# Patient Record
Sex: Female | Born: 1940 | Race: White | Hispanic: No | Smoking: Never smoker
Health system: Southern US, Community
[De-identification: ages and names within clinical notes are randomized; demographics above are authoritative.]

## PROBLEM LIST (undated history)

## (undated) DIAGNOSIS — C801 Malignant (primary) neoplasm, unspecified: Secondary | ICD-10-CM

## (undated) DIAGNOSIS — M1990 Unspecified osteoarthritis, unspecified site: Secondary | ICD-10-CM

## (undated) DIAGNOSIS — R42 Dizziness and giddiness: Secondary | ICD-10-CM

## (undated) DIAGNOSIS — K449 Diaphragmatic hernia without obstruction or gangrene: Secondary | ICD-10-CM

## (undated) DIAGNOSIS — I10 Essential (primary) hypertension: Secondary | ICD-10-CM

## (undated) DIAGNOSIS — I639 Cerebral infarction, unspecified: Secondary | ICD-10-CM

## (undated) HISTORY — DX: Essential (primary) hypertension: I10

## (undated) HISTORY — PX: WISDOM TOOTH EXTRACTION: SHX21

## (undated) HISTORY — DX: Unspecified osteoarthritis, unspecified site: M19.90

---

## 1973-09-27 HISTORY — PX: TONSILLECTOMY: SHX5217

## 1986-09-27 HISTORY — PX: DILATION AND CURETTAGE OF UTERUS: SHX78

## 2001-11-13 ENCOUNTER — Other Ambulatory Visit: Admission: RE | Admit: 2001-11-13 | Discharge: 2001-11-13 | Payer: Self-pay | Admitting: Internal Medicine

## 2005-02-25 ENCOUNTER — Other Ambulatory Visit: Admission: RE | Admit: 2005-02-25 | Discharge: 2005-02-25 | Payer: Self-pay | Admitting: Internal Medicine

## 2005-05-07 ENCOUNTER — Ambulatory Visit (HOSPITAL_COMMUNITY): Admission: RE | Admit: 2005-05-07 | Discharge: 2005-05-07 | Payer: Self-pay | Admitting: *Deleted

## 2005-05-07 ENCOUNTER — Encounter (INDEPENDENT_AMBULATORY_CARE_PROVIDER_SITE_OTHER): Payer: Self-pay | Admitting: Specialist

## 2008-11-14 ENCOUNTER — Other Ambulatory Visit: Admission: RE | Admit: 2008-11-14 | Discharge: 2008-11-14 | Payer: Self-pay | Admitting: Internal Medicine

## 2008-12-04 ENCOUNTER — Encounter: Admission: RE | Admit: 2008-12-04 | Discharge: 2008-12-04 | Payer: Self-pay | Admitting: Internal Medicine

## 2009-12-01 IMAGING — CT CT CHEST W/O CM
2 of 3 series · 15 of 36 positions shown, 18 images · non-contrast
Comparison: None

CLINICAL DATA: Swollen left supraclavicular region, evaluate for
adenopathy

CT CHEST WITHOUT CONTRAST
TECHNIQUE: Multidetector CT imaging of the chest was performed
following the standard protocol without IV contrast.

[Series 3: routine chest · axial · 0.70mm/px · z∈[-240,+20]mm · 12 of 62 slices shown, 15 images]
[im 5/62  mediastinal]
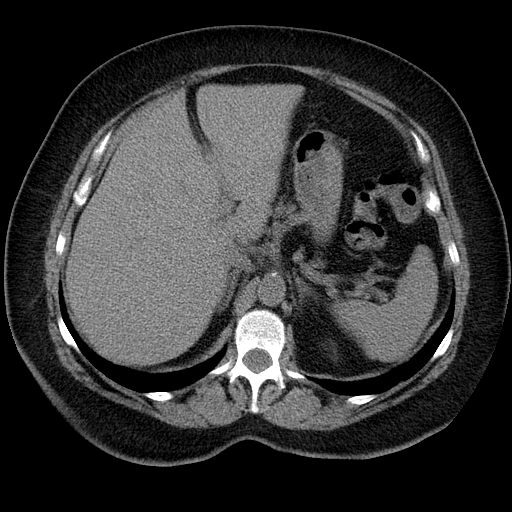
[im 5/62  lung]
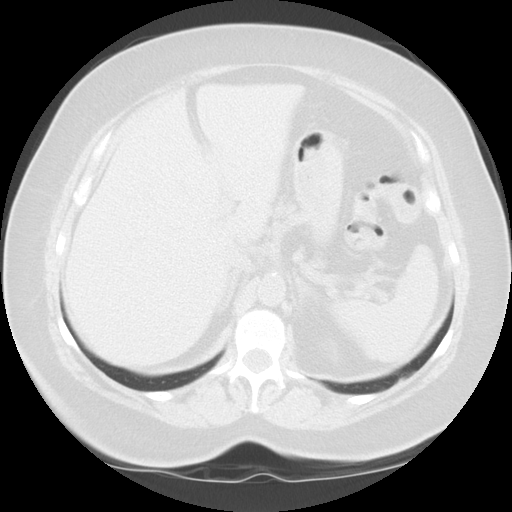
[im 10/62  lung]
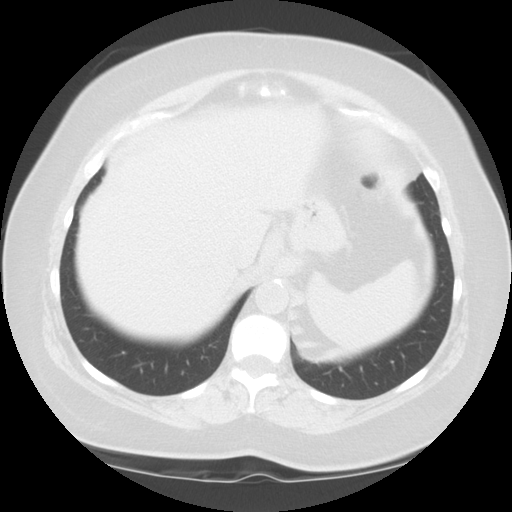
[im 14/62  lung]
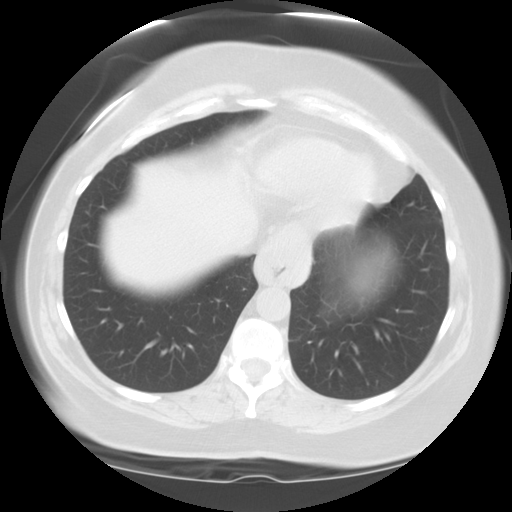
[im 19/62  lung]
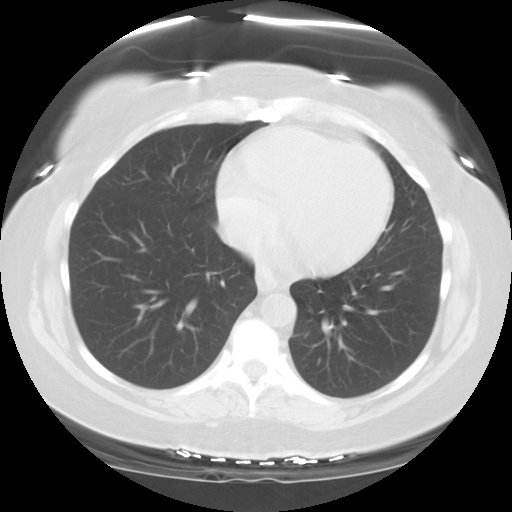
[im 23/62  mediastinal]
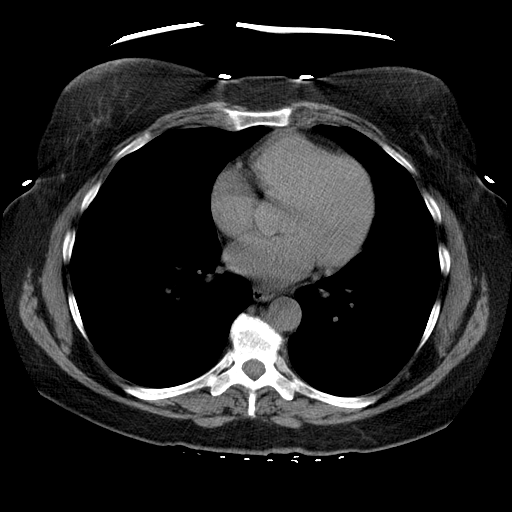
[im 23/62  lung]
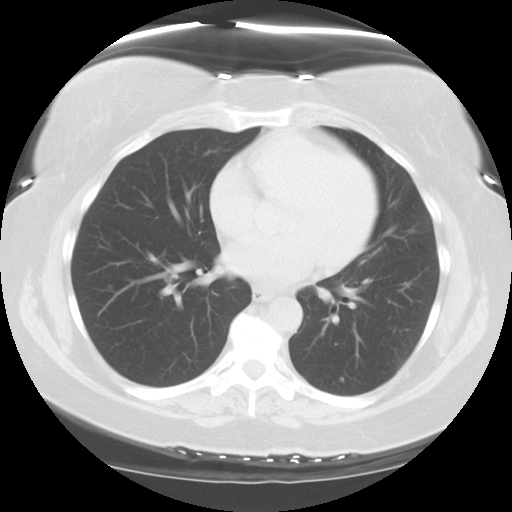
[im 28/62  lung]
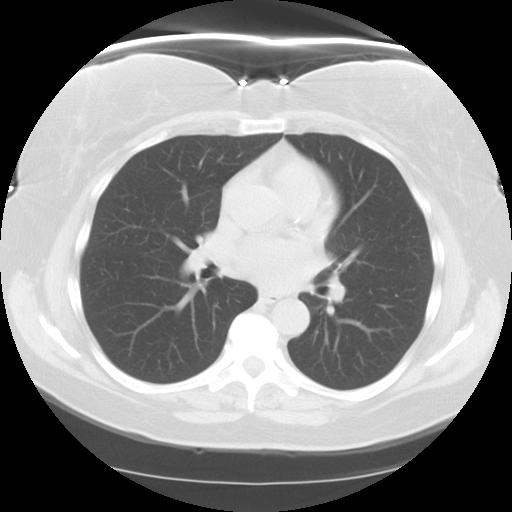
[im 34/62  lung]
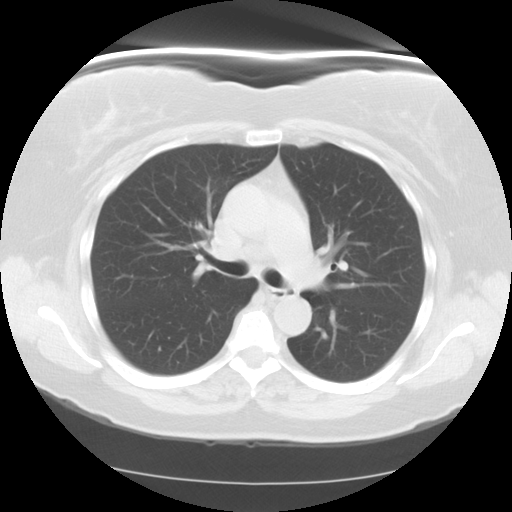
[im 39/62  lung]
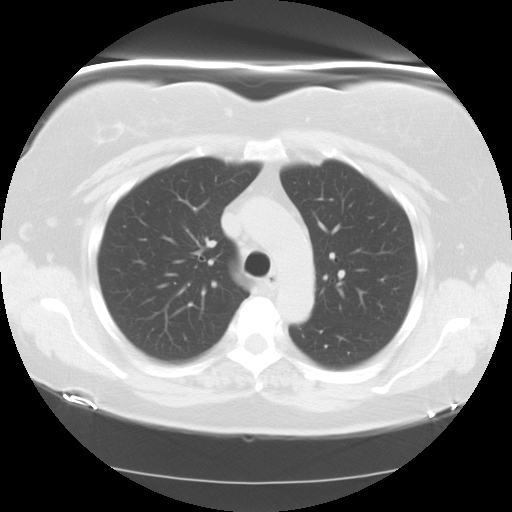
[im 43/62  mediastinal]
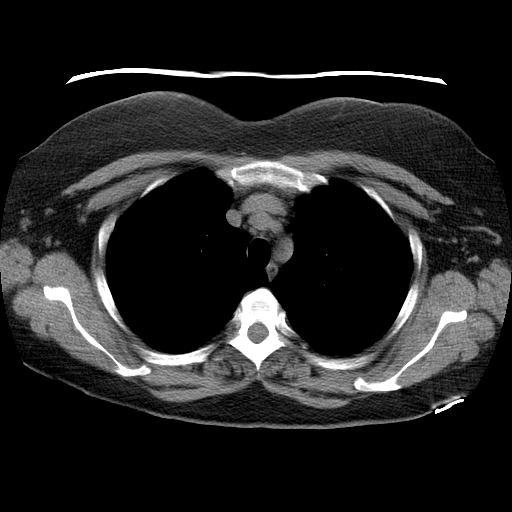
[im 43/62  lung]
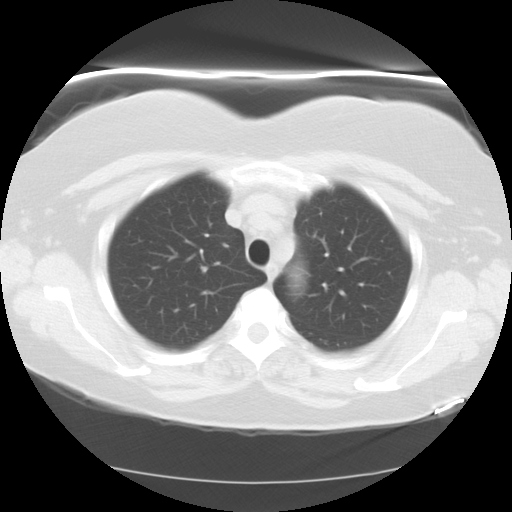
[im 48/62  lung]
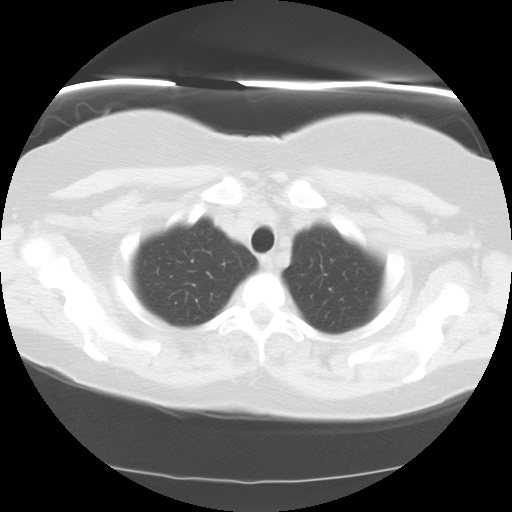
[im 52/62  lung]
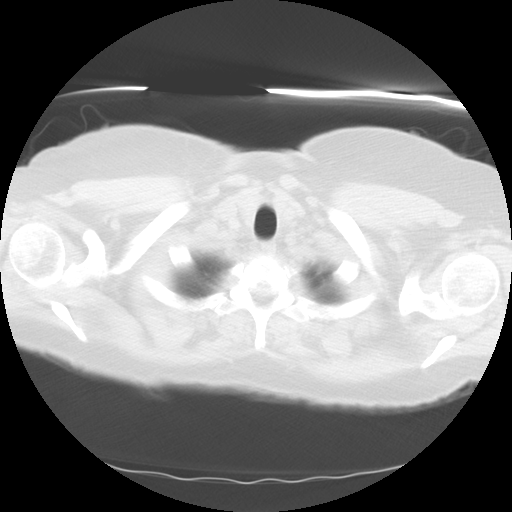
[im 57/62  lung]
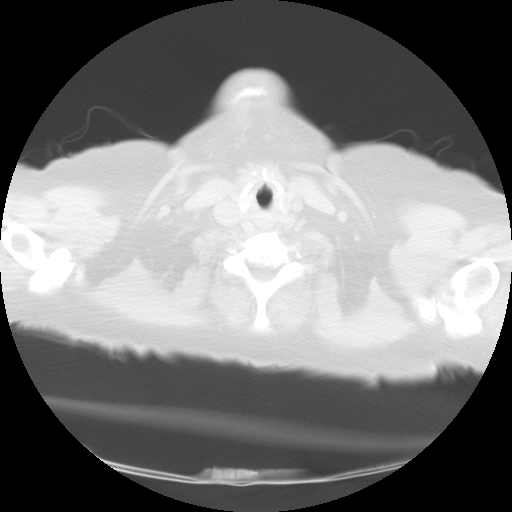

[Series 602: sagittal body · sagittal · 0.70mm/px · 3 of 145 slices shown]
[im 29/145  lung]
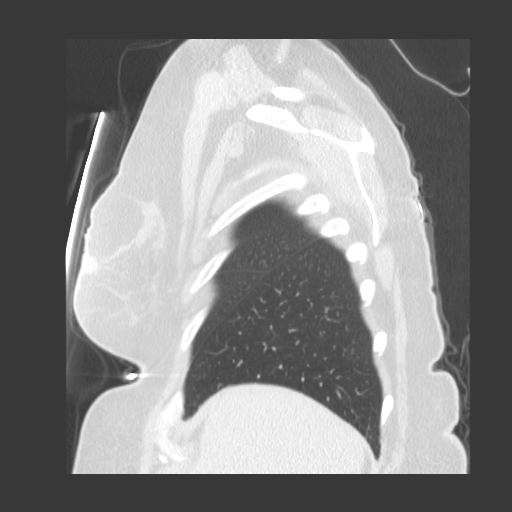
[im 58/145  lung]
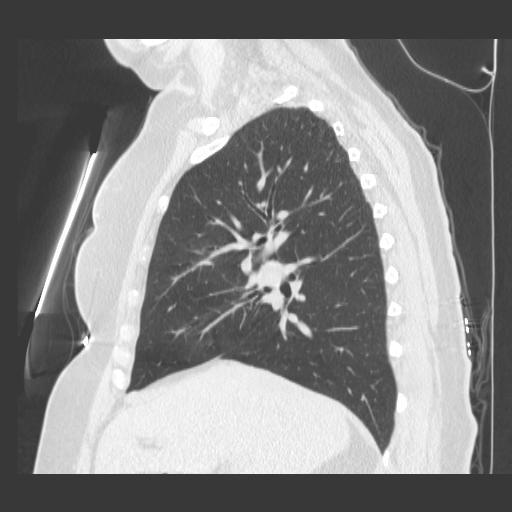
[im 87/145  lung]
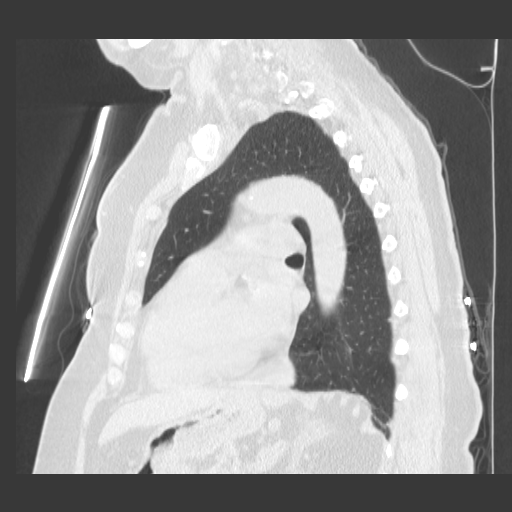

[15 of 36 positions shown; findings below may reference images not displayed]

FINDINGS: On the lung window images a few small lung nodules are
noted bilaterally, with one in the right lower lobe, another small
noncalcified nodule in the left lower lobe, and a small nodule in
the left lower lobe as well.  These nodules may well represent
prior granulomatous disease but follow-up is recommended. If the
patient is at high risk for bronchogenic carcinoma, follow-up chest
CT at 6-12 months is recommended.  If the patient is at low risk
for bronchogenic carcinoma, follow-up chest CT at 12 months is
recommended.  This recommendation follows the consensus statement:
"Guidelines for Management of Small Pulmonary Nodules Detected on
CT Scans: A Statement from the [HOSPITAL]" as published in
[URL] No
infiltrate or effusion is seen.

No mediastinal or hilar adenopathy is seen. No evidence of
supraclavicular adenopathy is seen.  The thyroid gland appears be
normal in size.  On only slightly prominent precarinal node is
present.  There is a small to moderate sized hiatal hernia present.
IMPRESSION: 1.  No evidence of supraclavicular, mediastinal, or hilar
adenopathy.
 2.  Small lung nodules as noted above.  Consider follow-up CT of
the chest with guidelines given above.
 3.  Moderate sized hiatal hernia.

## 2010-08-04 ENCOUNTER — Other Ambulatory Visit: Admission: RE | Admit: 2010-08-04 | Discharge: 2010-08-04 | Payer: Self-pay | Admitting: Internal Medicine

## 2011-02-12 NOTE — Op Note (Signed)
NAMECAMELLIA, Alyssa Taylor                 ACCOUNT NO.:  [id number]   MEDICAL RECORD NO.:  [id number]          PATIENT TYPE:  AMB   LOCATION:  ENDO                         FACILITY:  [facility]   PHYSICIAN:  Georgiana Spinner, M.D.    DATE OF BIRTH:  Nov 01, 1940   DATE OF PROCEDURE:  05/07/2005  DATE OF DISCHARGE:                                 OPERATIVE REPORT   PROCEDURE:  Colonoscopy.   INDICATIONS:  Colon cancer screening.   ANESTHESIA:  Demerol 30, Versed 3 mg.   DESCRIPTION OF PROCEDURE:  With the patient mildly sedated in the left  lateral decubitus position, the Olympus videoscopic colonoscope was inserted  into the rectum and passed under direct vision to the cecum identified by  the ileocecal valve and appendiceal orifice both of which were photographed.  From this point, we entered into the terminal colon which also appeared  normal. The endoscope was then withdrawn taking circumferential views of the  colonic mucosa stopping to photograph diverticula seen along the way in the  sigmoid colon until we reached the rectum which appeared normal on direct  and showed hemorrhoids on retroflexed view. The endoscope straightened and  withdrawn. The patient's vital signs and pulse oximeter remained stable. The  patient tolerated procedure well without apparent complications.   FINDINGS:  Internal hemorrhoids and some diverticula of the sigmoid colon  otherwise unremarkable examination.   PLAN:  See endoscopy note for further details.       GMO/MEDQ  D:  05/07/2005  T:  05/07/2005  Job:  [id number]

## 2011-02-12 NOTE — Op Note (Signed)
Alyssa Taylor, Alyssa Taylor                 ACCOUNT NO.:  [id number]   MEDICAL RECORD NO.:  [id number]          PATIENT TYPE:  AMB   LOCATION:  ENDO                         [facility]ACILITY:  [facility]   PHYSICIAN:  Georgiana Spinner, M.D.    DATE OF BIRTH:  [birth year]   DATE OF PROCEDURE:  05/07/2005  DATE OF DISCHARGE:                                 OPERATIVE REPORT   PROCEDURE:  Upper endoscopy.   INDICATIONS:  Gastroesophageal reflux disease.   ANESTHESIA:  Demerol 50, Versed 5 mg.   DESCRIPTION OF PROCEDURE:  With the patient mildly sedated in the left  lateral decubitus position, the Olympus videoscopic endoscope was inserted  in the mouth and passed under direct vision through the esophagus which  appeared normal until we reached the distal esophagus and there appeared to  be a small ulceration and erosions of the distal most esophagus which was  photographed at first. We then entered in the stomach, fundus, body, antrum,  duodenal bulb, second portion of duodenum were visualized. From this point,  the endoscope was slowly withdrawn taking circumferential views of duodenal  mucosa until the endoscope had been pulled back into the stomach, placed in  retroflexion to view the stomach from below at which point we biopsied these  ulcerations and erosions. The endoscope was then straightened and withdrawn  taking circumferential views of the remaining gastric and esophageal mucosa.  The patient's vital signs and pulse oximeter remained stable. The patient  tolerated the procedure well without apparent complications.   FINDINGS:  Erosions and ulceration, mild, of the distal most esophagus.  Await biopsy report. The patient will call me for results and follow-up with  me as an outpatient. Proceed to colonoscopy as planned.       GMO/MEDQ  D:  05/07/2005  T:  05/07/2005  Job:  [id number]

## 2011-07-26 ENCOUNTER — Other Ambulatory Visit (HOSPITAL_COMMUNITY): Payer: Self-pay | Admitting: Internal Medicine

## 2011-07-26 DIAGNOSIS — R911 Solitary pulmonary nodule: Secondary | ICD-10-CM

## 2011-07-29 ENCOUNTER — Other Ambulatory Visit (HOSPITAL_COMMUNITY): Payer: Self-pay

## 2011-10-18 ENCOUNTER — Other Ambulatory Visit: Payer: Self-pay | Admitting: Internal Medicine

## 2011-10-18 DIAGNOSIS — E041 Nontoxic single thyroid nodule: Secondary | ICD-10-CM

## 2011-10-20 ENCOUNTER — Other Ambulatory Visit (HOSPITAL_COMMUNITY)
Admission: RE | Admit: 2011-10-20 | Discharge: 2011-10-20 | Disposition: A | Discharge status: Home / Self Care | Payer: PRIVATE HEALTH INSURANCE | Source: Ambulatory Visit | Attending: Interventional Radiology | Admitting: Interventional Radiology

## 2011-10-20 ENCOUNTER — Ambulatory Visit
Admission: RE | Admit: 2011-10-20 | Discharge: 2011-10-20 | Disposition: A | Discharge status: Home / Self Care | Payer: PRIVATE HEALTH INSURANCE | Source: Ambulatory Visit | Attending: Internal Medicine | Admitting: Internal Medicine

## 2011-10-20 DIAGNOSIS — E041 Nontoxic single thyroid nodule: Secondary | ICD-10-CM | POA: Insufficient documentation

## 2012-08-08 ENCOUNTER — Other Ambulatory Visit (HOSPITAL_COMMUNITY): Payer: Self-pay | Admitting: Internal Medicine

## 2012-08-08 DIAGNOSIS — R0989 Other specified symptoms and signs involving the circulatory and respiratory systems: Secondary | ICD-10-CM

## 2012-08-11 ENCOUNTER — Ambulatory Visit (HOSPITAL_COMMUNITY): Payer: PRIVATE HEALTH INSURANCE

## 2012-08-18 ENCOUNTER — Ambulatory Visit (HOSPITAL_COMMUNITY): Payer: PRIVATE HEALTH INSURANCE | Attending: Internal Medicine

## 2012-10-16 IMAGING — US US THYROID BIOPSY
1 series · 14 of 14 positions shown · non-contrast
Comparison: 09/02/2011

CLINICAL DATA: Dominant right inferior thyroid nodule

ULTRASOUND GUIDED NEEDLE ASPIRATE BIOPSY OF THE THYROID GLAND

[Series 1: us thyroid biopsy · 0.08mm/px · 14 acquisitions, 14 frames shown]
[im 1/14]
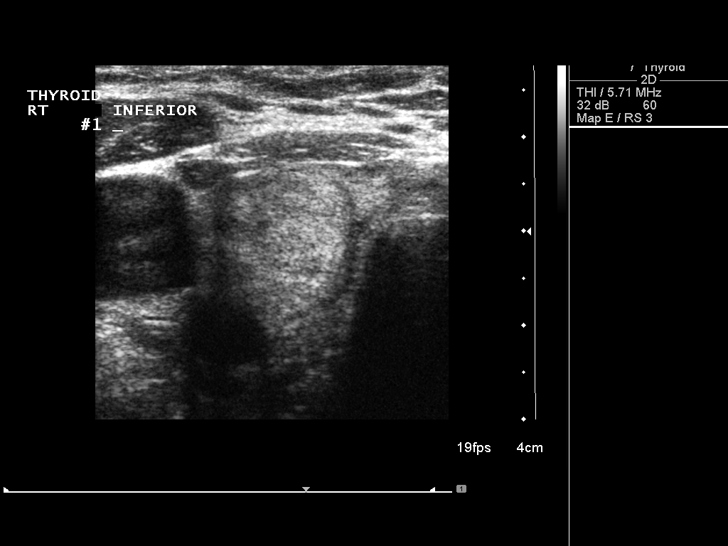
[im 2/14]
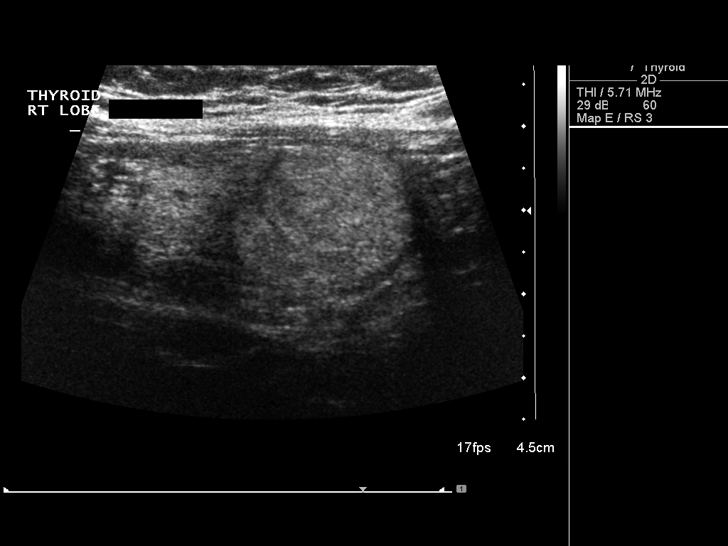
[im 3/14]
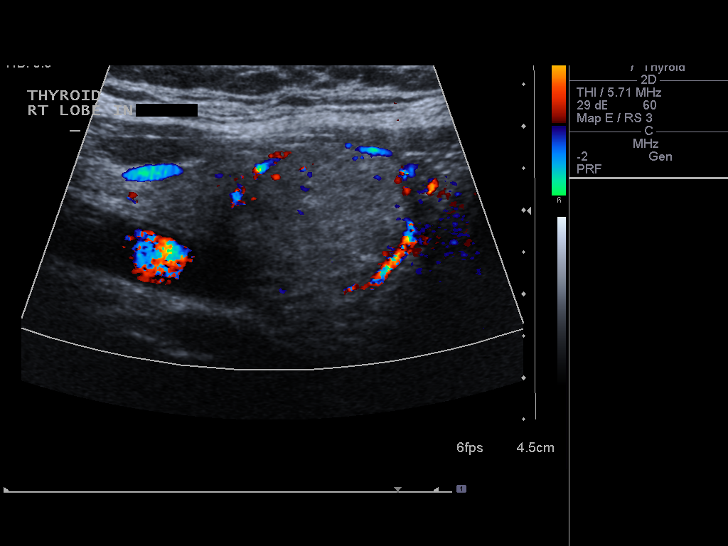
[im 4/14]
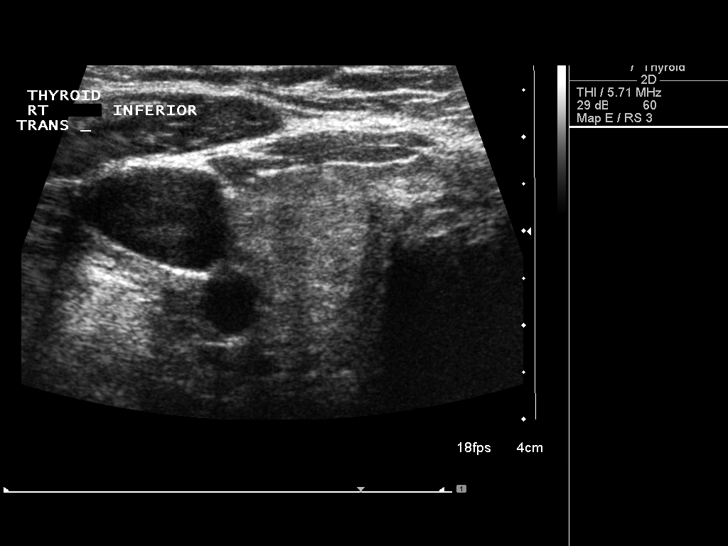
[im 5/14]
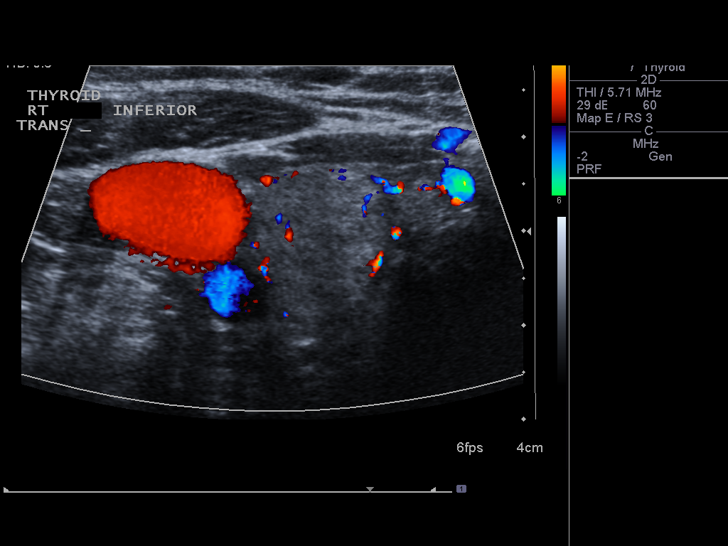
[im 6/14]
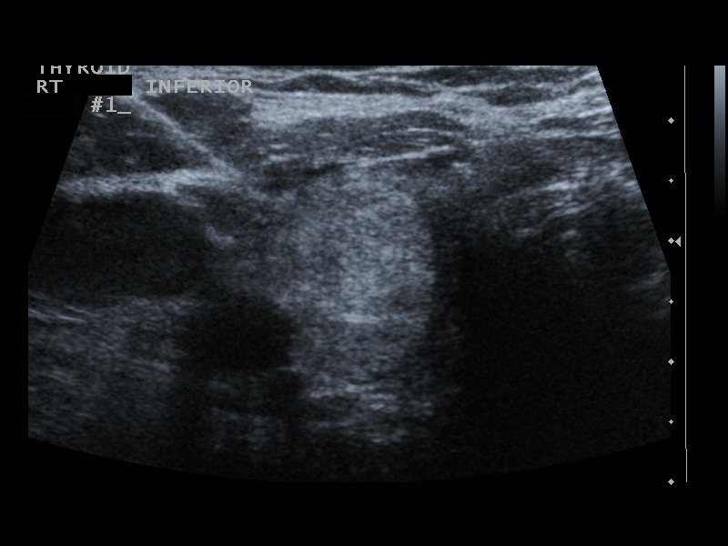
[im 7/14]
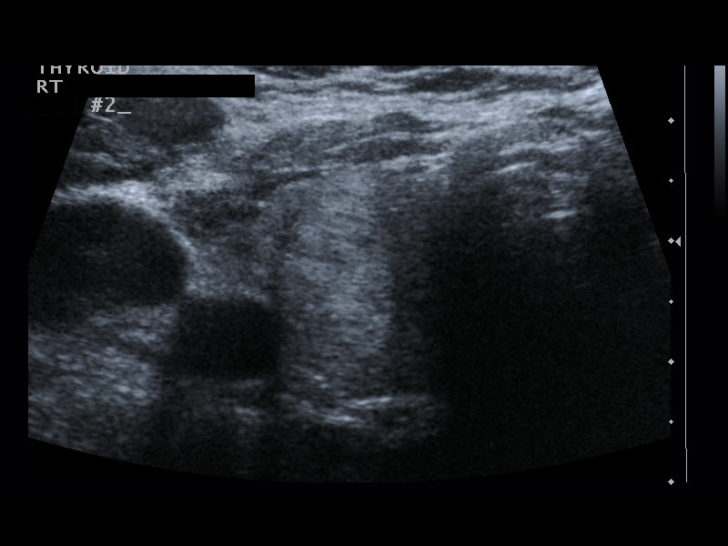
[im 8/14]
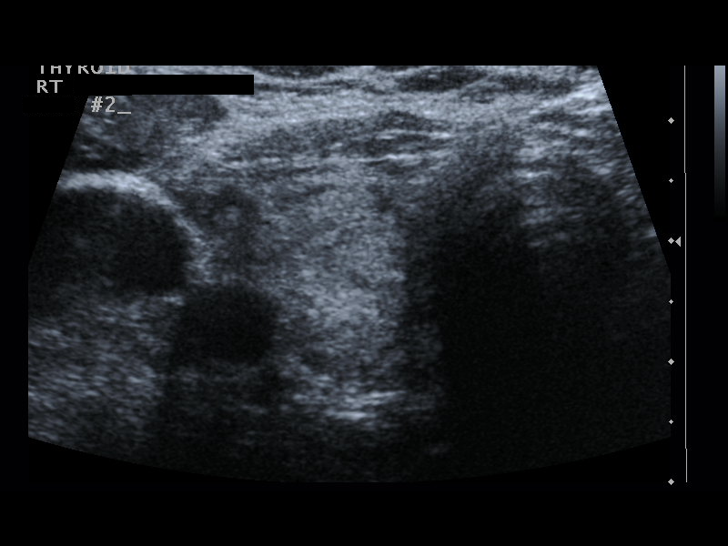
[im 9/14]
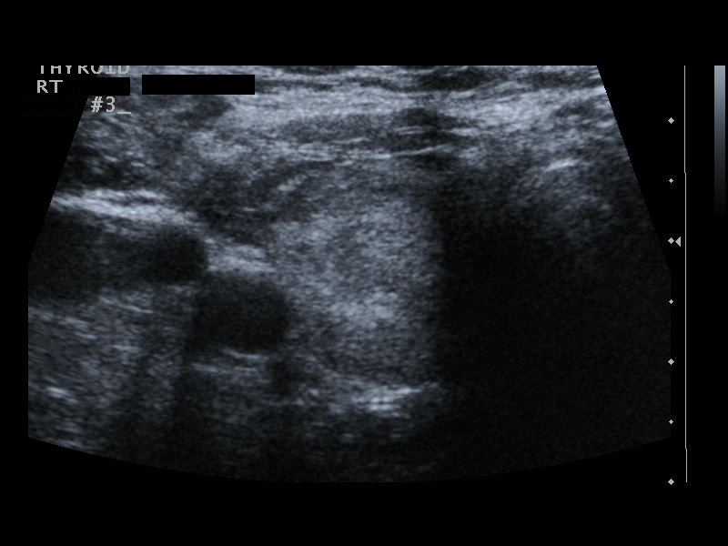
[im 10/14]
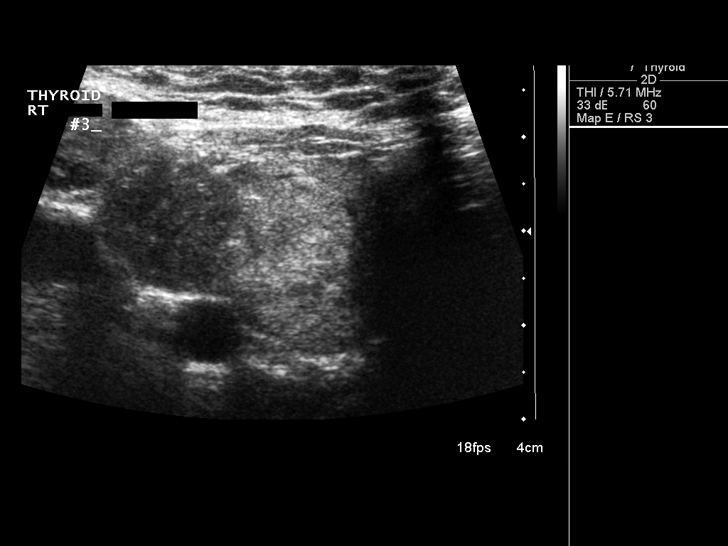
[im 11/14]
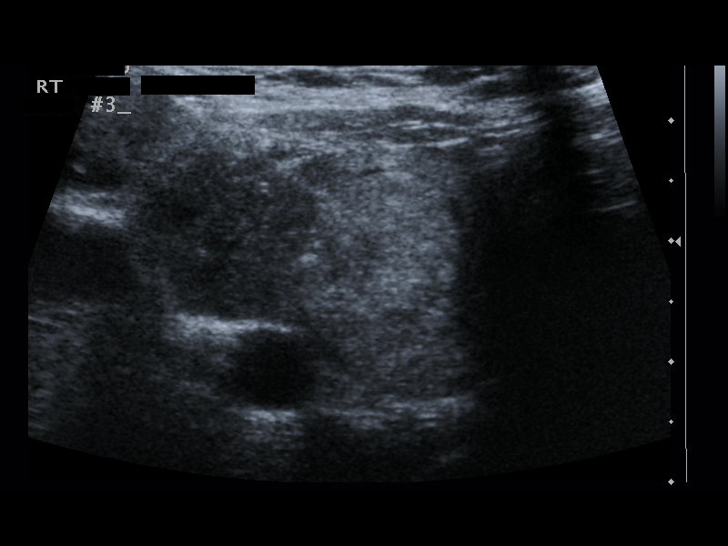
[im 12/14]
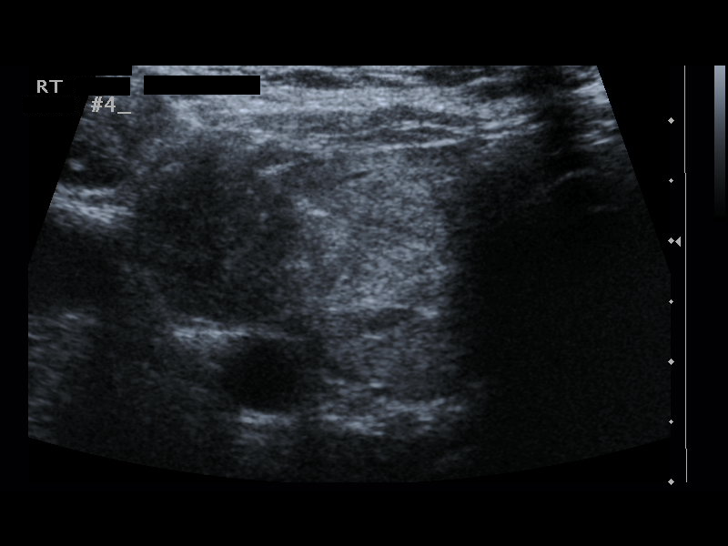
[im 13/14]
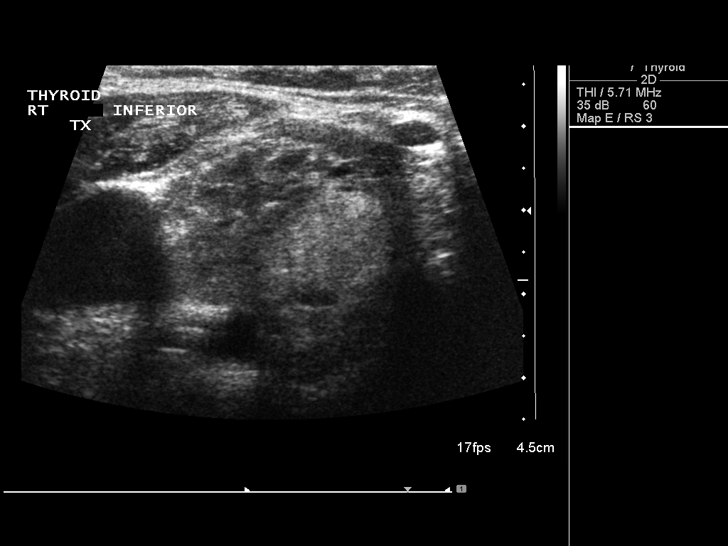
[im 14/14]
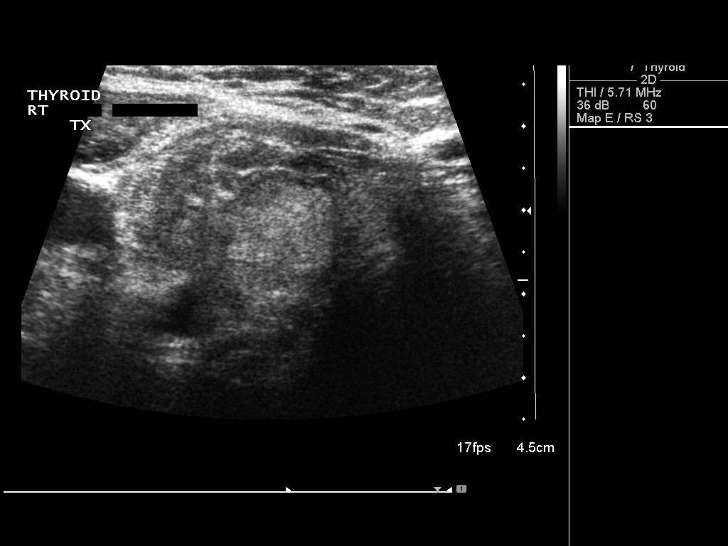

[14 of 14 positions shown; findings below may reference images not displayed]

Thyroid biopsy was thoroughly discussed with the patient and
questions were answered.  The benefits, risks, alternatives, and
complications were also discussed.  The patient understands and
wishes to proceed with the procedure.  Written consent was
obtained.

Ultrasound was performed to localize and mark an adequate site for
the biopsy.  The patient was then prepped and draped in a normal
sterile fashion.  Local anesthesia was provided with 1% lidocaine.
Using direct ultrasound guidance, 4 passes were made using 25 gauge
needles into the nodule within the right lobe of the thyroid.
Ultrasound was used to confirm needle placements on all occasions.
Specimens were sent to Pathology for analysis.

Complications:  No immediate
FINDINGS: Imaging confirms needle placement in the dominant solid
right inferior thyroid nodule.
IMPRESSION: Ultrasound guided needle aspirate biopsy performed of the dominant
right inferior thyroid nodule.

## 2016-02-02 DIAGNOSIS — H34831 Tributary (branch) retinal vein occlusion, right eye, with macular edema: Secondary | ICD-10-CM | POA: Diagnosis not present

## 2016-02-02 DIAGNOSIS — H25093 Other age-related incipient cataract, bilateral: Secondary | ICD-10-CM | POA: Diagnosis not present

## 2016-02-12 DIAGNOSIS — H34831 Tributary (branch) retinal vein occlusion, right eye, with macular edema: Secondary | ICD-10-CM | POA: Diagnosis not present

## 2016-03-16 DIAGNOSIS — Z0000 Encounter for general adult medical examination without abnormal findings: Secondary | ICD-10-CM | POA: Diagnosis not present

## 2016-03-16 DIAGNOSIS — I10 Essential (primary) hypertension: Secondary | ICD-10-CM | POA: Diagnosis not present

## 2016-03-17 DIAGNOSIS — I83812 Varicose veins of left lower extremities with pain: Secondary | ICD-10-CM | POA: Diagnosis not present

## 2016-03-17 DIAGNOSIS — R58 Hemorrhage, not elsewhere classified: Secondary | ICD-10-CM | POA: Diagnosis not present

## 2016-03-17 DIAGNOSIS — I8312 Varicose veins of left lower extremity with inflammation: Secondary | ICD-10-CM | POA: Diagnosis not present

## 2016-03-17 DIAGNOSIS — H539 Unspecified visual disturbance: Secondary | ICD-10-CM | POA: Diagnosis not present

## 2016-03-17 DIAGNOSIS — I8390 Asymptomatic varicose veins of unspecified lower extremity: Secondary | ICD-10-CM | POA: Diagnosis not present

## 2016-04-06 DIAGNOSIS — I8312 Varicose veins of left lower extremity with inflammation: Secondary | ICD-10-CM | POA: Diagnosis not present

## 2016-04-06 DIAGNOSIS — R58 Hemorrhage, not elsewhere classified: Secondary | ICD-10-CM | POA: Diagnosis not present

## 2016-07-15 DIAGNOSIS — M7062 Trochanteric bursitis, left hip: Secondary | ICD-10-CM | POA: Diagnosis not present

## 2016-08-25 DIAGNOSIS — S4992XA Unspecified injury of left shoulder and upper arm, initial encounter: Secondary | ICD-10-CM | POA: Diagnosis not present

## 2016-08-25 DIAGNOSIS — M25561 Pain in right knee: Secondary | ICD-10-CM | POA: Diagnosis not present

## 2016-08-25 DIAGNOSIS — S8990XA Unspecified injury of unspecified lower leg, initial encounter: Secondary | ICD-10-CM | POA: Diagnosis not present

## 2016-08-25 DIAGNOSIS — S8992XA Unspecified injury of left lower leg, initial encounter: Secondary | ICD-10-CM | POA: Diagnosis not present

## 2016-08-25 DIAGNOSIS — M75122 Complete rotator cuff tear or rupture of left shoulder, not specified as traumatic: Secondary | ICD-10-CM | POA: Diagnosis not present

## 2016-08-25 DIAGNOSIS — M25512 Pain in left shoulder: Secondary | ICD-10-CM | POA: Diagnosis not present

## 2016-08-25 DIAGNOSIS — S3993XA Unspecified injury of pelvis, initial encounter: Secondary | ICD-10-CM | POA: Diagnosis not present

## 2016-08-25 DIAGNOSIS — M79662 Pain in left lower leg: Secondary | ICD-10-CM | POA: Diagnosis not present

## 2016-08-25 DIAGNOSIS — I10 Essential (primary) hypertension: Secondary | ICD-10-CM | POA: Diagnosis not present

## 2016-08-25 DIAGNOSIS — S8012XA Contusion of left lower leg, initial encounter: Secondary | ICD-10-CM | POA: Diagnosis not present

## 2016-08-25 DIAGNOSIS — S79912A Unspecified injury of left hip, initial encounter: Secondary | ICD-10-CM | POA: Diagnosis not present

## 2016-08-25 DIAGNOSIS — S83401A Sprain of unspecified collateral ligament of right knee, initial encounter: Secondary | ICD-10-CM | POA: Diagnosis not present

## 2016-08-25 DIAGNOSIS — M25552 Pain in left hip: Secondary | ICD-10-CM | POA: Diagnosis not present

## 2016-08-25 DIAGNOSIS — R102 Pelvic and perineal pain: Secondary | ICD-10-CM | POA: Diagnosis not present

## 2016-08-26 DIAGNOSIS — I10 Essential (primary) hypertension: Secondary | ICD-10-CM | POA: Diagnosis not present

## 2016-08-26 DIAGNOSIS — M25512 Pain in left shoulder: Secondary | ICD-10-CM | POA: Diagnosis not present

## 2016-08-26 DIAGNOSIS — M25552 Pain in left hip: Secondary | ICD-10-CM | POA: Diagnosis not present

## 2016-08-26 DIAGNOSIS — W19XXXA Unspecified fall, initial encounter: Secondary | ICD-10-CM | POA: Diagnosis not present

## 2016-09-06 DIAGNOSIS — S8012XA Contusion of left lower leg, initial encounter: Secondary | ICD-10-CM | POA: Diagnosis not present

## 2016-09-06 DIAGNOSIS — S40012A Contusion of left shoulder, initial encounter: Secondary | ICD-10-CM | POA: Diagnosis not present

## 2016-09-17 DIAGNOSIS — R069 Unspecified abnormalities of breathing: Secondary | ICD-10-CM | POA: Diagnosis not present

## 2016-09-23 DIAGNOSIS — E785 Hyperlipidemia, unspecified: Secondary | ICD-10-CM | POA: Diagnosis not present

## 2016-09-23 DIAGNOSIS — Z780 Asymptomatic menopausal state: Secondary | ICD-10-CM | POA: Diagnosis not present

## 2016-09-23 DIAGNOSIS — I10 Essential (primary) hypertension: Secondary | ICD-10-CM | POA: Diagnosis not present

## 2016-09-23 DIAGNOSIS — Z0000 Encounter for general adult medical examination without abnormal findings: Secondary | ICD-10-CM | POA: Diagnosis not present

## 2016-11-25 DIAGNOSIS — H34831 Tributary (branch) retinal vein occlusion, right eye, with macular edema: Secondary | ICD-10-CM | POA: Diagnosis not present

## 2016-11-25 DIAGNOSIS — H34833 Tributary (branch) retinal vein occlusion, bilateral, with macular edema: Secondary | ICD-10-CM | POA: Diagnosis not present

## 2016-12-23 DIAGNOSIS — H34832 Tributary (branch) retinal vein occlusion, left eye, with macular edema: Secondary | ICD-10-CM | POA: Diagnosis not present

## 2016-12-23 DIAGNOSIS — H34833 Tributary (branch) retinal vein occlusion, bilateral, with macular edema: Secondary | ICD-10-CM | POA: Diagnosis not present

## 2017-01-19 DIAGNOSIS — Z0000 Encounter for general adult medical examination without abnormal findings: Secondary | ICD-10-CM | POA: Diagnosis not present

## 2017-01-19 DIAGNOSIS — Z9181 History of falling: Secondary | ICD-10-CM | POA: Diagnosis not present

## 2017-01-19 DIAGNOSIS — M130 Polyarthritis, unspecified: Secondary | ICD-10-CM | POA: Diagnosis not present

## 2017-01-19 DIAGNOSIS — Z791 Long term (current) use of non-steroidal anti-inflammatories (NSAID): Secondary | ICD-10-CM | POA: Diagnosis not present

## 2017-01-19 DIAGNOSIS — Z7982 Long term (current) use of aspirin: Secondary | ICD-10-CM | POA: Diagnosis not present

## 2017-01-19 DIAGNOSIS — M25559 Pain in unspecified hip: Secondary | ICD-10-CM | POA: Diagnosis not present

## 2017-01-19 DIAGNOSIS — M4055 Lordosis, unspecified, thoracolumbar region: Secondary | ICD-10-CM | POA: Diagnosis not present

## 2017-01-19 DIAGNOSIS — Z6837 Body mass index (BMI) 37.0-37.9, adult: Secondary | ICD-10-CM | POA: Diagnosis not present

## 2017-01-19 DIAGNOSIS — E669 Obesity, unspecified: Secondary | ICD-10-CM | POA: Diagnosis not present

## 2017-01-19 DIAGNOSIS — Z79899 Other long term (current) drug therapy: Secondary | ICD-10-CM | POA: Diagnosis not present

## 2017-01-25 DIAGNOSIS — H34833 Tributary (branch) retinal vein occlusion, bilateral, with macular edema: Secondary | ICD-10-CM | POA: Diagnosis not present

## 2017-04-12 DIAGNOSIS — H34833 Tributary (branch) retinal vein occlusion, bilateral, with macular edema: Secondary | ICD-10-CM | POA: Diagnosis not present

## 2017-05-13 DIAGNOSIS — H348332 Tributary (branch) retinal vein occlusion, bilateral, stable: Secondary | ICD-10-CM | POA: Diagnosis not present

## 2017-05-23 DIAGNOSIS — M25512 Pain in left shoulder: Secondary | ICD-10-CM | POA: Diagnosis not present

## 2017-05-23 DIAGNOSIS — M25561 Pain in right knee: Secondary | ICD-10-CM | POA: Diagnosis not present

## 2017-05-23 DIAGNOSIS — G8929 Other chronic pain: Secondary | ICD-10-CM | POA: Diagnosis not present

## 2017-05-23 DIAGNOSIS — Z0000 Encounter for general adult medical examination without abnormal findings: Secondary | ICD-10-CM | POA: Diagnosis not present

## 2017-08-02 DIAGNOSIS — M19011 Primary osteoarthritis, right shoulder: Secondary | ICD-10-CM | POA: Diagnosis not present

## 2017-08-02 DIAGNOSIS — M353 Polymyalgia rheumatica: Secondary | ICD-10-CM | POA: Diagnosis not present

## 2017-08-02 DIAGNOSIS — M25512 Pain in left shoulder: Secondary | ICD-10-CM | POA: Diagnosis not present

## 2017-08-02 DIAGNOSIS — M129 Arthropathy, unspecified: Secondary | ICD-10-CM | POA: Diagnosis not present

## 2017-08-02 DIAGNOSIS — M7072 Other bursitis of hip, left hip: Secondary | ICD-10-CM | POA: Diagnosis not present

## 2017-08-02 DIAGNOSIS — M25511 Pain in right shoulder: Secondary | ICD-10-CM | POA: Diagnosis not present

## 2017-08-02 DIAGNOSIS — M19012 Primary osteoarthritis, left shoulder: Secondary | ICD-10-CM | POA: Diagnosis not present

## 2017-08-02 DIAGNOSIS — M25519 Pain in unspecified shoulder: Secondary | ICD-10-CM | POA: Diagnosis not present

## 2017-08-24 DIAGNOSIS — Z0000 Encounter for general adult medical examination without abnormal findings: Secondary | ICD-10-CM | POA: Diagnosis not present

## 2018-08-22 DIAGNOSIS — E785 Hyperlipidemia, unspecified: Secondary | ICD-10-CM | POA: Diagnosis not present

## 2018-08-22 DIAGNOSIS — I10 Essential (primary) hypertension: Secondary | ICD-10-CM | POA: Diagnosis not present

## 2018-08-22 DIAGNOSIS — Z0001 Encounter for general adult medical examination with abnormal findings: Secondary | ICD-10-CM | POA: Diagnosis not present

## 2018-08-30 DIAGNOSIS — R7989 Other specified abnormal findings of blood chemistry: Secondary | ICD-10-CM | POA: Diagnosis not present

## 2018-08-30 DIAGNOSIS — Z01419 Encounter for gynecological examination (general) (routine) without abnormal findings: Secondary | ICD-10-CM | POA: Diagnosis not present

## 2018-08-30 DIAGNOSIS — Z0000 Encounter for general adult medical examination without abnormal findings: Secondary | ICD-10-CM | POA: Diagnosis not present

## 2018-08-30 DIAGNOSIS — Z780 Asymptomatic menopausal state: Secondary | ICD-10-CM | POA: Diagnosis not present

## 2020-12-05 DIAGNOSIS — I639 Cerebral infarction, unspecified: Secondary | ICD-10-CM | POA: Insufficient documentation

## 2020-12-06 DIAGNOSIS — I10 Essential (primary) hypertension: Secondary | ICD-10-CM | POA: Insufficient documentation

## 2021-10-29 ENCOUNTER — Other Ambulatory Visit: Payer: Self-pay | Admitting: Registered Nurse

## 2021-10-29 DIAGNOSIS — Z8673 Personal history of transient ischemic attack (TIA), and cerebral infarction without residual deficits: Secondary | ICD-10-CM

## 2021-10-29 DIAGNOSIS — I6522 Occlusion and stenosis of left carotid artery: Secondary | ICD-10-CM

## 2021-11-27 ENCOUNTER — Telehealth: Payer: Self-pay | Admitting: *Deleted

## 2021-11-27 NOTE — Telephone Encounter (Addendum)
Called and left the patient a message to call the office back. Patient needs to be scheduled with Dr Berline Lopes as a new patient on 3/13 ?

## 2021-11-30 NOTE — Telephone Encounter (Signed)
Called and left the patient a message to call the office back; patient needs to be scheduled with Dr Berline Lopes as a new patient the week of 3/13 ?

## 2021-11-30 NOTE — Telephone Encounter (Signed)
Spoke with Ms Alyssa Taylor regarding her referral to GYN oncology. She has an appointment scheduled with Dr. Jeral Pinch on March 13 at 1115. Patient agrees to date and time. She has been provided with office address and location. She is also aware of our mask and visitor policy. Patient verbalized understanding and will call with any questions.   ?

## 2021-12-04 ENCOUNTER — Encounter: Payer: Self-pay | Admitting: Gynecologic Oncology

## 2021-12-07 ENCOUNTER — Other Ambulatory Visit: Payer: Self-pay

## 2021-12-07 ENCOUNTER — Encounter: Payer: Self-pay | Admitting: Gynecologic Oncology

## 2021-12-07 ENCOUNTER — Inpatient Hospital Stay (HOSPITAL_BASED_OUTPATIENT_CLINIC_OR_DEPARTMENT_OTHER): Payer: Medicare Other | Admitting: Gynecologic Oncology

## 2021-12-07 ENCOUNTER — Inpatient Hospital Stay: Payer: Medicare Other | Attending: Gynecologic Oncology | Admitting: Gynecologic Oncology

## 2021-12-07 VITALS — BP 188/76 | HR 61 | Temp 97.8°F | Resp 18 | Ht 61.61 in | Wt 205.0 lb

## 2021-12-07 DIAGNOSIS — Z79899 Other long term (current) drug therapy: Secondary | ICD-10-CM | POA: Diagnosis not present

## 2021-12-07 DIAGNOSIS — I6522 Occlusion and stenosis of left carotid artery: Secondary | ICD-10-CM | POA: Insufficient documentation

## 2021-12-07 DIAGNOSIS — N8502 Endometrial intraepithelial neoplasia [EIN]: Secondary | ICD-10-CM | POA: Diagnosis present

## 2021-12-07 DIAGNOSIS — M353 Polymyalgia rheumatica: Secondary | ICD-10-CM | POA: Insufficient documentation

## 2021-12-07 DIAGNOSIS — M549 Dorsalgia, unspecified: Secondary | ICD-10-CM | POA: Insufficient documentation

## 2021-12-07 DIAGNOSIS — N8501 Benign endometrial hyperplasia: Secondary | ICD-10-CM | POA: Insufficient documentation

## 2021-12-07 DIAGNOSIS — M1990 Unspecified osteoarthritis, unspecified site: Secondary | ICD-10-CM | POA: Insufficient documentation

## 2021-12-07 DIAGNOSIS — Z8673 Personal history of transient ischemic attack (TIA), and cerebral infarction without residual deficits: Secondary | ICD-10-CM | POA: Insufficient documentation

## 2021-12-07 DIAGNOSIS — G8929 Other chronic pain: Secondary | ICD-10-CM | POA: Insufficient documentation

## 2021-12-07 DIAGNOSIS — E785 Hyperlipidemia, unspecified: Secondary | ICD-10-CM | POA: Insufficient documentation

## 2021-12-07 DIAGNOSIS — E669 Obesity, unspecified: Secondary | ICD-10-CM | POA: Insufficient documentation

## 2021-12-07 DIAGNOSIS — Z780 Asymptomatic menopausal state: Secondary | ICD-10-CM | POA: Diagnosis not present

## 2021-12-07 DIAGNOSIS — I10 Essential (primary) hypertension: Secondary | ICD-10-CM | POA: Diagnosis not present

## 2021-12-07 DIAGNOSIS — K219 Gastro-esophageal reflux disease without esophagitis: Secondary | ICD-10-CM | POA: Insufficient documentation

## 2021-12-07 NOTE — Progress Notes (Signed)
GYNECOLOGIC ONCOLOGY NEW PATIENT CONSULTATION   Patient Name: Alyssa Taylor  Patient Age: 81 y.o. Date of Service: 12/07/2021 Referring Provider: Dr. Arlan Organ  Primary Care Provider: Pcp, No Consulting Provider: Jeral Pinch, MD   Assessment/Plan:  Postmenopausal patient with CAH.    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    The patient is a suitable candidate for hysterectomy via a minimally invasive approach to surgery.  Given that she is postmenopausal, a bilateral salpingo-oophorectomy is also recommended.  We reviewed that robotic assistance would be used to complete the surgery.  We discussed that endometrial cancer is detected in about 40% of final uterine pathology specimens from patients with CAH.  We will thus plan to send the uterus for intraoperative frozen pathology.  If cancer is identified at the time of surgery, additional procedures including lymph node evaluation, is recommended.  With regard to progesterone therapy, we reviewed the role of progesterone therapy and the effect on preneoplastic lesions, believed to include induction of apoptosis in addition to tissue sloughing during withdrawal bleeding.  Activation of the progesterone receptors is believed to lead stromal decidualization and thinning of the lining.  We reviewed the 3 most studied options, to include levonorgesterol IUD (24mg/d), oral medorxyprogesterone acetate '10mg'$  daily or cyclically 122-29days per month, or oral megesterol acetate 40-'200mg'$  per day.  Given biopsy and concern for underlying cancer, I recommended that we proceed with definitive surgery.  After discussing treatment options, the patient would like to start with more conservative treatment with progesterone therapy. She recently started on z-radical, a brown seaweed component, and would like to give this  and hormonal therapy a try.   She understands that all progesterone options have few side effects, most common being infrequent edema, GI disturbances, and thromboembolic events), but that local progesterone through IUD may have a stronger effect on the endometrium with less systemic side effects.  Furthermore, studies demonstrate that 80-90% of women will have regression of their hyperplasia with progesterone use.    With IUD use, we anticipate that the average duration to regression is 4.5 months, with all cases anticipated to have regression by 9 months.     She has elected to proceed with Mirena IUD insertion. I recommended that we perform a D&C  for further tissue sampling. She was amenable to proceeding with D&C, possible hysteroscopy, Mirena IUD insertion.  For monitoring, she understands that there is no recommend standard of care.  We will base her surveillance on GOG 224 protocol.  This will include EMB at 3 months following initiation of treatment.  EMBs will be performed at 3 to 6 month intervals, until a minimum of 3 negative biopsy results are obtained, after which sampling frequeny may then be yearly or until new abnormal uterine bleeding develops.  If persistence of CAH is noted by 9 months of treatment, then we will discuss additional agents or fitness/readiness for surgery.    We also reviewed the importance of weight loss in overall health as well as reduction in cancer risk.   She voiced a clear understanding. She had the opportunity to ask questions. Perioperative instructions were reviewed with her. Prescriptions for post-op medications were sent to her pharmacy of choice.  A copy of this note was sent to the patient's referring provider.   65 minutes of total time was spent for this patient encounter, including preparation, face-to-face counseling with the patient  and coordination of care, and documentation of the encounter.   Jeral Pinch, MD  Division of Gynecologic  Oncology  Department of Obstetrics and Gynecology  [facility]  ___________________________________________  Chief Complaint: Chief Complaint  Patient presents with   Complex endometrial hyperplasia    History of Present Illness:  Alyssa Taylor is a 81 y.o. y.o. female who is seen in consultation at the request of Dr. 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  Patient endorses menopause at age 44.  She had an episode of postmenopausal bleeding last year that stopped spontaneously.  In February, she began experiencing postmenopausal bleeding and pelvic pain.  Her initial episode lasted for 5 days.  She then called and was seen by her OB/GYN.  She describes the bleeding as bright red.  It has stopped since the biopsy.  She also notes some mid pelvic pain.  She underwent endometrial biopsy on 2/8 revealing at least complex hyperplasia with atypia.  The tissue is very fragmented and inflamed and some of the glands appear to have lost by definition, so focally, a malignant process cannot be completely ruled out.  Patient endorses a good appetite without nausea or emesis.  She endorses normal bowel and bladder function.    In terms of her medical history, she endorses hypertension and arthritis.  On chart review, it looks like she was admitted a year ago after presenting with imbalance, right lower extremity weakness, and left visual field defects thought to be related to TIA.  She was started on aspirin, Lipitor, and Plavix.  She is no longer on Plavix.  PAST MEDICAL HISTORY:  Past Medical History:  Diagnosis Date   Arthritis    right knee   Hypertension      PAST SURGICAL HISTORY:  Past Surgical History:  Procedure Laterality Date   DILATION AND CURETTAGE OF UTERUS N/A 1975   after a pregnancy   TONSILLECTOMY N/A 1988   TUBAL LIGATION  1984    OB/GYN HISTORY:  OB History  Gravida Para Term Preterm AB Living  '2 1      1 1  '$ SAB IAB Ectopic Multiple Live Births               # Outcome Date GA Lbr Len/2nd Weight Sex Delivery Anes PTL Lv  2 AB           1 Para             No LMP recorded.  Age at menarche: 9 Age at menopause: 7 Hx of HRT: Denies Hx of STDs: Denies Last pap: 11/16/2018-negative, atrophic pattern.  HPV HR mRNA E6/87 not detected History of abnormal pap smears: Denies  SCREENING STUDIES:  Last mammogram: Does not remember  Last colonoscopy: 2021  MEDICATIONS: Outpatient Encounter Medications as of 12/07/2021  Medication Sig   aspirin 81 MG chewable tablet 1 tablet   Cholecalciferol (VITAMIN D3 PO) Take 500 Int'l Units by mouth 2 (two) times daily.   lisinopril (ZESTRIL) 10 MG tablet Take 5 mg by mouth Nightly.   [DISCONTINUED] aspirin 81 MG chewable tablet Chew by mouth.   [DISCONTINUED] Cholecalciferol (VITAMIN D) 50 MCG (2000 UT) CAPS 1 capsule   atorvastatin (LIPITOR) 40 MG tablet Take 5 mg by mouth once. (Patient not taking: Reported on 12/07/2021)   No facility-administered encounter medications on file as of 12/07/2021.    ALLERGIES:  Allergies  Allergen Reactions   Codeine     Other reaction(s):  nausea     FAMILY HISTORY:  Family History  Problem Relation Age of Onset   Colon cancer Maternal Grandmother    Breast cancer Neg Hx    Ovarian cancer Neg Hx    Endometrial cancer Neg Hx    Pancreatic cancer Neg Hx    Prostate cancer Neg Hx      SOCIAL HISTORY:  Social Connections: Not on file    REVIEW OF SYSTEMS:  Denies appetite changes, fevers, chills, fatigue, unexplained weight changes. Denies hearing loss, neck lumps or masses, mouth sores, ringing in ears or voice changes. Denies cough or wheezing.  Denies shortness of breath. Denies chest pain or palpitations. Denies leg swelling. Denies abdominal distention, pain, blood in stools, constipation, diarrhea, nausea, vomiting, or early satiety. Denies pain with intercourse, dysuria, frequency, hematuria  or incontinence. Denies hot flashes, pelvic pain, vaginal bleeding or vaginal discharge.   Denies joint pain, back pain or muscle pain/cramps. Denies itching, rash, or wounds. Denies dizziness, headaches, numbness or seizures. Denies swollen lymph nodes or glands, denies easy bruising or bleeding. Denies anxiety, depression, confusion, or decreased concentration.  Physical Exam:  Vital Signs for this encounter:  Blood pressure (!) 188/76, pulse 61, temperature 97.8 F (36.6 C), temperature source Oral, resp. rate 18, height 5' 1.61" (1.565 m), weight 205 lb (93 kg), SpO2 97 %. Body mass index is 37.97 kg/m. General: Alert, oriented, no acute distress.  HEENT: Normocephalic, atraumatic. Sclera anicteric.  Chest: Clear to auscultation bilaterally. No wheezes, rhonchi, or rales. Cardiovascular: Regular rate and rhythm, no murmurs, rubs, or gallops.  Abdomen: Obese. Normoactive bowel sounds. Soft, nondistended, nontender to palpation. No masses or hepatosplenomegaly appreciated. No palpable fluid wave.  Extremities: Grossly normal range of motion. Warm, well perfused. No edema bilaterally.  Skin: No rashes or lesions.  Lymphatics: No cervical, supraclavicular, or inguinal adenopathy.  GU:  Normal external female genitalia. No lesions. No discharge or bleeding.             Bladder/urethra:  No lesions or masses, well supported bladder             Vagina: Mildly atrophic, no bleeding or discharge.             Cervix: Normal appearing, no lesions.  Atrophic             Uterus: Small, mobile, no parametrial involvement or nodularity.             Adnexa: No masses appreciated.  Rectal: Deferred  LABORATORY AND RADIOLOGIC DATA:  Outside medical records were reviewed to synthesize the above history, along with the history and physical obtained during the visit.   No results found for: WBC, HGB, HCT, PLT, GLUCOSE, CHOL, TRIG, HDL, LDLDIRECT, LDLCALC, ALT, AST, NA, K, CL, CREATININE, BUN, CO2,  TSH, PSA, INR, GLUF, HGBA1C, MICROALBUR

## 2021-12-07 NOTE — H&P (View-Only) (Signed)
GYNECOLOGIC ONCOLOGY NEW PATIENT CONSULTATION  ? ?Patient Name: Alyssa Taylor  ?Patient Age: 81 y.o. ?Date of Service: 12/07/2021 ?Referring Provider: Dr. Arlan Organ ? ?Primary Care Provider: Pcp, No ?Consulting Provider: Jeral Pinch, MD  ? ?Assessment/Plan:  ?Postmenopausal patient with CAH.   ? 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   ? ?The patient is a suitable candidate for hysterectomy via a minimally invasive approach to surgery.  Given that she is postmenopausal, a bilateral salpingo-oophorectomy is also recommended.  We reviewed that robotic assistance would be used to complete the surgery.  We discussed that endometrial cancer is detected in about 40% of final uterine pathology specimens from patients with CAH.  We will thus plan to send the uterus for intraoperative frozen pathology.  If cancer is identified at the time of surgery, additional procedures including lymph node evaluation, is recommended. ? ?With regard to progesterone therapy, we reviewed the role of progesterone therapy and the effect on preneoplastic lesions, believed to include induction of apoptosis in addition to tissue sloughing during withdrawal bleeding.  Activation of the progesterone receptors is believed to lead stromal decidualization and thinning of the lining.  We reviewed the 3 most studied options, to include levonorgesterol IUD (80mg/d), oral medorxyprogesterone acetate '10mg'$  daily or cyclically 116-10days per month, or oral megesterol acetate 40-'200mg'$  per day. ? ?Given biopsy and concern for underlying cancer, I recommended that we proceed with definitive surgery. ? ?After discussing treatment options, the patient would like to start with more conservative treatment with progesterone therapy. She recently started on z-radical, a brown seaweed component, and would like to give this  and hormonal therapy a try.  ? ?She understands that all progesterone options have few side effects, most common being infrequent edema, GI disturbances, and thromboembolic events), but that local progesterone through IUD may have a stronger effect on the endometrium with less systemic side effects.  Furthermore, studies demonstrate that 80-90% of women will have regression of their hyperplasia with progesterone use.   ? ?With IUD use, we anticipate that the average duration to regression is 4.5 months, with all cases anticipated to have regression by 9 months.    ? ?She has elected to proceed with Mirena IUD insertion. I recommended that we perform a D&C  for further tissue sampling. She was amenable to proceeding with D&C, possible hysteroscopy, Mirena IUD insertion. ? ?For monitoring, she understands that there is no recommend standard of care.  We will base her surveillance on GOG 224 protocol.  This will include EMB at 3 months following initiation of treatment.  EMBs will be performed at 3 to 6 month intervals, until a minimum of 3 negative biopsy results are obtained, after which sampling frequeny may then be yearly or until new abnormal uterine bleeding develops.  If persistence of CAH is noted by 9 months of treatment, then we will discuss additional agents or fitness/readiness for surgery.   ? ?We also reviewed the importance of weight loss in overall health as well as reduction in cancer risk.  ? ?She voiced a clear understanding. She had the opportunity to ask questions. Perioperative instructions were reviewed with her. Prescriptions for post-op medications were sent to her pharmacy of choice. ? ?A copy of this note was sent to the patient's referring provider.  ? ?65 minutes of total time was spent for this patient encounter, including preparation, face-to-face counseling with the patient  and coordination of care, and documentation of the encounter.  ? ?Jeral Pinch, MD  ?Division of Gynecologic  Oncology  ?Department of Obstetrics and Gynecology  ?[facility]  ?___________________________________________  ?Chief Complaint: ?Chief Complaint  ?Patient presents with  ? Complex endometrial hyperplasia  ? ? ?History of Present Illness:  ?Alyssa Taylor is a 81 y.o. y.o. female who is seen in consultation at the request of Dr. 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 ? ?Patient endorses menopause at age 38.  She had an episode of postmenopausal bleeding last year that stopped spontaneously.  In February, she began experiencing postmenopausal bleeding and pelvic pain.  Her initial episode lasted for 5 days.  She then called and was seen by her OB/GYN.  She describes the bleeding as bright red.  It has stopped since the biopsy.  She also notes some mid pelvic pain. ? ?She underwent endometrial biopsy on 2/8 revealing at least complex hyperplasia with atypia.  The tissue is very fragmented and inflamed and some of the glands appear to have lost by definition, so focally, a malignant process cannot be completely ruled out. ? ?Patient endorses a good appetite without nausea or emesis.  She endorses normal bowel and bladder function.   ? ?In terms of her medical history, she endorses hypertension and arthritis.  On chart review, it looks like she was admitted a year ago after presenting with imbalance, right lower extremity weakness, and left visual field defects thought to be related to TIA.  She was started on aspirin, Lipitor, and Plavix.  She is no longer on Plavix. ? ?PAST MEDICAL HISTORY:  ?Past Medical History:  ?Diagnosis Date  ? 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 Hypertension   ?  ? ?PAST SURGICAL HISTORY:  ?Past Surgical History:  ?Procedure Laterality Date  ? DILATION AND CURETTAGE OF UTERUS N/A 1975  ? after a pregnancy  ? TONSILLECTOMY N/A 1988  ? TUBAL LIGATION  1984  ? ? ?OB/GYN HISTORY:  ?OB History  ?Gravida Para Term Preterm AB Living  ?'2 1      1 1  '$ ?SAB IAB Ectopic Multiple Live Births  ?           ?  ?# Outcome Date GA Lbr Len/2nd Weight Sex Delivery Anes PTL Lv  ?2 AB           ?1 Para           ? ? ?No LMP recorded. ? ?Age at menarche: 53 ?Age at menopause: 11 ?Hx of HRT: Denies ?Hx of STDs: Denies ?Last pap: 11/16/2018-negative, atrophic pattern.  HPV HR mRNA E6/87 not detected ?History of abnormal pap smears: Denies ? ?SCREENING STUDIES:  ?Last mammogram: Does not remember  ?Last colonoscopy: 2021 ? ?MEDICATIONS: ?Outpatient Encounter Medications as of 12/07/2021  ?Medication Sig  ? aspirin 81 MG chewable tablet 1 tablet  ? Cholecalciferol (VITAMIN D3 PO) Take 500 Int'l Units by mouth 2 (two) times daily.  ? lisinopril (ZESTRIL) 10 MG tablet Take 5 mg by mouth Nightly.  ? [DISCONTINUED] aspirin 81 MG chewable tablet Chew by mouth.  ? [DISCONTINUED] Cholecalciferol (VITAMIN D) 50 MCG (2000 UT) CAPS 1 capsule  ? atorvastatin (LIPITOR) 40 MG tablet Take 5 mg by mouth once. (Patient not taking: Reported on 12/07/2021)  ? ?No facility-administered encounter medications on file as of 12/07/2021.  ? ? ?ALLERGIES:  ?Allergies  ?Allergen Reactions  ? Codeine   ?  Other reaction(s):  nausea  ?  ? ?FAMILY HISTORY:  ?Family History  ?Problem Relation Age of Onset  ? Colon cancer Maternal Grandmother   ? Breast cancer Neg Hx   ? Ovarian cancer Neg Hx   ? Endometrial cancer Neg Hx   ? Pancreatic cancer Neg Hx   ? Prostate cancer Neg Hx   ?  ? ?SOCIAL HISTORY:  ?Social Connections: Not on file  ? ? ?REVIEW OF SYSTEMS:  ?Denies appetite changes, fevers, chills, fatigue, unexplained weight changes. ?Denies hearing loss, neck lumps or masses, mouth sores, ringing in ears or voice changes. ?Denies cough or wheezing.  Denies shortness of breath. ?Denies chest pain or palpitations. Denies leg swelling. ?Denies abdominal distention, pain, blood in stools, constipation, diarrhea, nausea, vomiting, or early satiety. ?Denies pain with intercourse, dysuria, frequency, hematuria  or incontinence. ?Denies hot flashes, pelvic pain, vaginal bleeding or vaginal discharge.   ?Denies joint pain, back pain or muscle pain/cramps. ?Denies itching, rash, or wounds. ?Denies dizziness, head

## 2021-12-07 NOTE — Patient Instructions (Signed)
Preparing for your Surgery ? ?Plan for surgery on December 16, 2021 with Dr. Jeral Pinch at [facility]. You will be scheduled for dilation and curettage of the uterus (dilating the cervix and sampling the lining of the uterus) with possible hysteroscopy (looking with a camera) with myosure, Mirena intrauterine device placement (placement of IUD).  ? ?Pre-operative Testing ?-You will receive a phone call from presurgical testing at [facility] to discuss surgery instructions and arrange for lab work if needed. ? ?-Bring your insurance card, copy of an advanced directive if applicable, medication list. ? ?-You are fine to keep taking your aspirin 81 mg with your last dose being the day before surgery. ? ?-Do not take supplements such as fish oil (omega 3), red yeast rice, turmeric before your surgery. You want to avoid medications with aspirin in them including headache powders such as BC or Goody's), Excedrin migraine. ? ?Day Before Surgery at Home ?-You will be advised you can have clear liquids up until 3 hours before your surgery.   ? ?Your role in recovery ?Your role is to become active as soon as directed by your doctor, while still giving yourself time to heal.  Rest when you feel tired. You will be asked to do the following in order to speed your recovery: ? ?- Cough and breathe deeply. This helps to clear and expand your lungs and can prevent pneumonia after surgery.  ?- STAY ACTIVE WHEN YOU GET HOME. Do mild physical activity. Walking or moving your legs help your circulation and body functions return to normal. Do not try to get up or walk alone the first time after surgery.   ?-If you develop swelling on one leg or the other, pain in the back of your leg, redness/warmth in one of your legs, please call the office or go to the Emergency Room to have a doppler to rule out a blood clot. For shortness of breath, chest pain-seek care in the Emergency Room as soon as possible. ?-  Actively manage your pain. Managing your pain lets you move in comfort. We will ask you to rate your pain on a scale of zero to 10. It is your responsibility to tell your doctor or nurse where and how much you hurt so your pain can be treated. ? ?Special Considerations ?-Your final pathology results from surgery should be available around one week after surgery and the results will be relayed to you when available. ? ?-FMLA forms can be faxed to [phone] and please allow 5-7 business days for completion. ? ?Pain Management After Surgery ?-Make sure that you have Tylenol and Ibuprofen if you are able to take these medications at home to use on a regular basis after surgery for pain control.  ? ?Bowel Regimen ?-It is important to prevent constipation and drink adequate amounts of liquids.  ? ?Risks of Surgery ?Risks of surgery are low but include bleeding, infection, damage to surrounding structures, re-operation, blood clots, and very rarely death. ? ?AFTER SURGERY INSTRUCTIONS ? ?Return to work:  1-2 days if applicable ? ?Activity: ?1. Be up and out of the bed during the day.  Take a nap if needed.  You may walk up steps but be careful and use the hand rail.  Stair climbing will tire you more than you think, you may need to stop part way and rest.  ? ?2. No lifting or straining for 1-2 weeks over 10 pounds. No pushing, pulling, straining for 1-2  weeks. ? ?3. No driving for minimum 24 hours after surgery if you were cleared to drive before surgery.  Do not drive if you are taking narcotic pain medicine and make sure that your reaction time has returned.  ? ?4. You can shower as soon as the next day after surgery. Shower daily. No tub baths or submerging your body in water until cleared by your surgeon for 4 weeks. ? ?5. No sexual activity and nothing in the vagina for 4 weeks. ? ?6. You may experience vaginal spotting and discharge after surgery.  The spotting is normal but if you experience heavy bleeding, call  our office. ? ?7. Take Tylenol or ibuprofen for pain if you are able to take these medications. Monitor your Tylenol intake to a max of 4,000 mg in a 24 hour period.  ? ?Diet: ?1. Low sodium Heart Healthy Diet is recommended but you are cleared to resume your normal (before surgery) diet after your procedure. ? ?2. It is safe to use a laxative, such as Miralax or Colace, if you have difficulty moving your bowels.  ? ?Reasons to call the Doctor: ?Fever - Oral temperature greater than 100.4 degrees Fahrenheit ?Foul-smelling vaginal discharge ?Difficulty urinating ?Nausea and vomiting ?Difficulty breathing with or without chest pain ?New calf pain especially if only on one side ?Sudden, continuing increased vaginal bleeding with or without clots. ?  ?Contacts: ?For questions or concerns you should contact: ? ?Dr. Jeral Pinch at [phone] ? ?[name], NP at [phone] ? ?After Hours: call [phone] and have the GYN Oncologist paged/contacted (after 5 pm or on the weekends). ? ?Messages sent via mychart are for non-urgent matters and are not responded to after hours so for urgent needs, please call the after hours number. ? ? ? ?  ?

## 2021-12-10 ENCOUNTER — Telehealth: Payer: Self-pay

## 2021-12-10 ENCOUNTER — Other Ambulatory Visit (HOSPITAL_COMMUNITY): Payer: Self-pay

## 2021-12-10 NOTE — Progress Notes (Signed)
Patient here with her husband for new patient consultation with Dr. Berline Lopes and for a pre-operative discussion prior to her scheduled surgery on December 16, 2021. She is scheduled for dilation and curettage of the uterus with possible hysteroscopy with myosure, Mirena intrauterine device placement. The surgery was discussed in detail.  See after visit summary for additional details. Visual aids used to discuss items related to surgery including sequential compression stockings, IV pump, multi-modal pain regimen including tylenol, Mirena IUD, female reproductive system to discuss surgery in detail.    ?  ?Discussed post-op pain management in detail including the aspects of the enhanced recovery pathway. We discussed the use of tylenol post-op and to monitor for a maximum of 4,000 mg in a 24 hour period. Discussed bowel regimen in detail.   ?  ?Discussed the use of SCDs and measures to take at home to prevent DVT including frequent mobility.  Reportable signs and symptoms of DVT discussed. Post-operative instructions discussed and expectations for after surgery. Incisional care discussed as well including reportable signs and symptoms including erythema, drainage, wound separation.  ?   ?5 minutes spent with the patient.  Verbalizing understanding of material discussed. No needs or concerns voiced at the end of the visit. Advised patient and family to call for any needs.  ? ?This appointment is included in the global surgical bundle as pre-operative teaching and has no charge.     ?

## 2021-12-10 NOTE — Patient Instructions (Signed)
Preparing for your Surgery ?  ?Plan for surgery on December 16, 2021 with Dr. Jeral Pinch at [facility]. You will be scheduled for dilation and curettage of the uterus (dilating the cervix and sampling the lining of the uterus) with possible hysteroscopy (looking with a camera) with myosure, Mirena intrauterine device placement (placement of IUD).  ?  ?Pre-operative Testing ?-You will receive a phone call from presurgical testing at [facility] to discuss surgery instructions and arrange for lab work if needed. ?  ?-Bring your insurance card, copy of an advanced directive if applicable, medication list. ?  ?-You are fine to keep taking your aspirin 81 mg with your last dose being the day before surgery. ?  ?-Do not take supplements such as fish oil (omega 3), red yeast rice, turmeric before your surgery. You want to avoid medications with aspirin in them including headache powders such as BC or Goody's), Excedrin migraine. ?  ?Day Before Surgery at Home ?-You will be advised you can have clear liquids up until 3 hours before your surgery.   ?  ?Your role in recovery ?Your role is to become active as soon as directed by your doctor, while still giving yourself time to heal.  Rest when you feel tired. You will be asked to do the following in order to speed your recovery: ?  ?- Cough and breathe deeply. This helps to clear and expand your lungs and can prevent pneumonia after surgery.  ?- STAY ACTIVE WHEN YOU GET HOME. Do mild physical activity. Walking or moving your legs help your circulation and body functions return to normal. Do not try to get up or walk alone the first time after surgery.   ?-If you develop swelling on one leg or the other, pain in the back of your leg, redness/warmth in one of your legs, please call the office or go to the Emergency Room to have a doppler to rule out a blood clot. For shortness of breath, chest pain-seek care in the Emergency Room as soon as  possible. ?- Actively manage your pain. Managing your pain lets you move in comfort. We will ask you to rate your pain on a scale of zero to 10. It is your responsibility to tell your doctor or nurse where and how much you hurt so your pain can be treated. ?  ?Special Considerations ?-Your final pathology results from surgery should be available around one week after surgery and the results will be relayed to you when available. ?  ?-FMLA forms can be faxed to [phone] and please allow 5-7 business days for completion. ?  ?Pain Management After Surgery ?-Make sure that you have Tylenol and Ibuprofen if you are able to take these medications at home to use on a regular basis after surgery for pain control.  ?  ?Bowel Regimen ?-It is important to prevent constipation and drink adequate amounts of liquids.  ?  ?Risks of Surgery ?Risks of surgery are low but include bleeding, infection, damage to surrounding structures, re-operation, blood clots, and very rarely death. ?  ?AFTER SURGERY INSTRUCTIONS ?  ?Return to work:  1-2 days if applicable ?  ?Activity: ?1. Be up and out of the bed during the day.  Take a nap if needed.  You may walk up steps but be careful and use the hand rail.  Stair climbing will tire you more than you think, you may need to stop part way and rest.  ?  ?  2. No lifting or straining for 1-2 weeks over 10 pounds. No pushing, pulling, straining for 1-2 weeks. ?  ?3. No driving for minimum 24 hours after surgery if you were cleared to drive before surgery.  Do not drive if you are taking narcotic pain medicine and make sure that your reaction time has returned.  ?  ?4. You can shower as soon as the next day after surgery. Shower daily. No tub baths or submerging your body in water until cleared by your surgeon for 4 weeks. ?  ?5. No sexual activity and nothing in the vagina for 4 weeks. ?  ?6. You may experience vaginal spotting and discharge after surgery.  The spotting is normal but if you  experience heavy bleeding, call our office. ?  ?7. Take Tylenol or ibuprofen for pain if you are able to take these medications. Monitor your Tylenol intake to a max of 4,000 mg in a 24 hour period.  ?  ?Diet: ?1. Low sodium Heart Healthy Diet is recommended but you are cleared to resume your normal (before surgery) diet after your procedure. ?  ?2. It is safe to use a laxative, such as Miralax or Colace, if you have difficulty moving your bowels.  ?  ?Reasons to call the Doctor: ?Fever - Oral temperature greater than 100.4 degrees Fahrenheit ?Foul-smelling vaginal discharge ?Difficulty urinating ?Nausea and vomiting ?Difficulty breathing with or without chest pain ?New calf pain especially if only on one side ?Sudden, continuing increased vaginal bleeding with or without clots. ?  ?Contacts: ?For questions or concerns you should contact: ?  ?Dr. Jeral Pinch at [phone] ?  ?[name], NP at [phone] ?  ?After Hours: call [phone] and have the GYN Oncologist paged/contacted (after 5 pm or on the weekends). ?  ?Messages sent via mychart are for non-urgent matters and are not responded to after hours so for urgent needs, please call the after hours number. ?

## 2021-12-10 NOTE — Telephone Encounter (Signed)
Attempted to contact [name] regarding her upcoming surgery. Unable to contact patient, left message requesting return call.  ?

## 2021-12-11 NOTE — Telephone Encounter (Signed)
Attempted to contact Alyssa Taylor this morning regarding her upcoming procedure. Unable to reach her, left message requesting return call. ?

## 2021-12-11 NOTE — Telephone Encounter (Signed)
Spoke with Alyssa Taylor this afternoon. Advised patient that her insurance will not cover/pay for the [facility] IUD itself along with the insertion. Dr. Berline Lopes states we need to still proceed with the [facility] and then she can take progesterone orally by mouth. Patient verbalized understanding.  ?Reviewed upcoming appointment details for ultrasound with patient and instructed that she will be getting a call from the hospital before her procedure on 12/16/21. Someone from our office will also call her and check in with her on 12/15/21. Patient verbalized understanding and appreciative of information.  ?

## 2021-12-14 ENCOUNTER — Encounter (HOSPITAL_BASED_OUTPATIENT_CLINIC_OR_DEPARTMENT_OTHER): Payer: Self-pay | Admitting: Gynecologic Oncology

## 2021-12-14 ENCOUNTER — Other Ambulatory Visit: Payer: Self-pay

## 2021-12-14 ENCOUNTER — Telehealth: Payer: Self-pay | Admitting: *Deleted

## 2021-12-14 NOTE — Telephone Encounter (Signed)
Spoke with pt this afternoon regarding her concerns for her IUD not being covered by insurance. Educated pt that we are replacing the IUD with oral progesterone. Pt debated on proceeding with the D&C or not. Educated pt that we will take samplings from the [facility] and sent it off to lab to give [place] a better picture of what is going on in the uterus. She was agreeable to continue with the surgery. Reminded pt that she has a ultra sound appointment tomorrow. She verbalized understanding and did not voice any other concerns.  ?

## 2021-12-14 NOTE — Progress Notes (Signed)
Spoke w/ via phone for pre-op interview: patient ?Lab needs dos: I-Stat, EKG ?Lab results: Echo 12/07/20 ?COVID test: patient states asymptomatic no test needed. ?Arrive at 12:40 12/16/21 ?NPO after MN except clear liquids.Clear liquids from MN until 11:40 ?Med rec completed. ?Medications to take morning of surgery: none ?Diabetic medication: NA ?Patient instructed no nail polish to be worn day of surgery. ?Patient instructed to bring photo id and insurance card day of surgery. ?Patient aware to have Driver (ride ) / caregiver for 24 hours after surgery. (Husband to drive) ?Patient Special Instructions: no need to hold aspirin per surgeon ?Pre-Op special Istructions: NA ?Patient verbalized understanding of instructions that were given at this phone interview. ?Patient denies shortness of breath, chest pain, fever, cough at this phone interview.  ?

## 2021-12-15 ENCOUNTER — Telehealth: Payer: Self-pay

## 2021-12-15 ENCOUNTER — Ambulatory Visit (HOSPITAL_COMMUNITY)
Admission: RE | Admit: 2021-12-15 | Discharge: 2021-12-15 | Disposition: A | Discharge status: Home / Self Care | Payer: Medicare Other | Source: Ambulatory Visit | Attending: Gynecologic Oncology | Admitting: Gynecologic Oncology

## 2021-12-15 DIAGNOSIS — N8501 Benign endometrial hyperplasia: Secondary | ICD-10-CM | POA: Insufficient documentation

## 2021-12-15 NOTE — Telephone Encounter (Signed)
Attempted to reach patient to check in with her pre-operatively. Unable to reach patient. Left message requesting return call.  

## 2021-12-16 ENCOUNTER — Other Ambulatory Visit: Payer: Self-pay

## 2021-12-16 ENCOUNTER — Ambulatory Visit (HOSPITAL_BASED_OUTPATIENT_CLINIC_OR_DEPARTMENT_OTHER): Payer: Medicare Other | Admitting: Anesthesiology

## 2021-12-16 ENCOUNTER — Ambulatory Visit (HOSPITAL_BASED_OUTPATIENT_CLINIC_OR_DEPARTMENT_OTHER)
Admission: RE | Admit: 2021-12-16 | Discharge: 2021-12-16 | Disposition: A | Discharge status: Home / Self Care | Payer: Medicare Other | Attending: Gynecologic Oncology | Admitting: Gynecologic Oncology

## 2021-12-16 ENCOUNTER — Telehealth: Payer: Self-pay

## 2021-12-16 ENCOUNTER — Encounter (HOSPITAL_BASED_OUTPATIENT_CLINIC_OR_DEPARTMENT_OTHER)
Admission: RE | Disposition: A | Discharge status: Home / Self Care | Payer: Self-pay | Source: Home / Self Care | Attending: Gynecologic Oncology

## 2021-12-16 ENCOUNTER — Encounter (HOSPITAL_BASED_OUTPATIENT_CLINIC_OR_DEPARTMENT_OTHER): Payer: Self-pay | Admitting: Gynecologic Oncology

## 2021-12-16 DIAGNOSIS — I10 Essential (primary) hypertension: Secondary | ICD-10-CM | POA: Insufficient documentation

## 2021-12-16 DIAGNOSIS — K219 Gastro-esophageal reflux disease without esophagitis: Secondary | ICD-10-CM | POA: Insufficient documentation

## 2021-12-16 DIAGNOSIS — M1990 Unspecified osteoarthritis, unspecified site: Secondary | ICD-10-CM | POA: Diagnosis not present

## 2021-12-16 DIAGNOSIS — N8501 Benign endometrial hyperplasia: Secondary | ICD-10-CM

## 2021-12-16 DIAGNOSIS — C541 Malignant neoplasm of endometrium: Secondary | ICD-10-CM | POA: Insufficient documentation

## 2021-12-16 DIAGNOSIS — Z79899 Other long term (current) drug therapy: Secondary | ICD-10-CM | POA: Insufficient documentation

## 2021-12-16 DIAGNOSIS — N8502 Endometrial intraepithelial neoplasia [EIN]: Secondary | ICD-10-CM

## 2021-12-16 DIAGNOSIS — M353 Polymyalgia rheumatica: Secondary | ICD-10-CM | POA: Insufficient documentation

## 2021-12-16 DIAGNOSIS — Z8673 Personal history of transient ischemic attack (TIA), and cerebral infarction without residual deficits: Secondary | ICD-10-CM | POA: Diagnosis not present

## 2021-12-16 HISTORY — PX: DILATATION & CURETTAGE/HYSTEROSCOPY WITH MYOSURE: SHX6511

## 2021-12-16 HISTORY — DX: Dizziness and giddiness: R42

## 2021-12-16 HISTORY — DX: Cerebral infarction, unspecified: I63.9

## 2021-12-16 HISTORY — DX: Diaphragmatic hernia without obstruction or gangrene: K44.9

## 2021-12-16 LAB — POCT I-STAT, CHEM 8
BUN: 17 mg/dL (ref 8–23)
Calcium, Ion: 1.18 mmol/L (ref 1.15–1.40)
Chloride: 104 mmol/L (ref 98–111)
Creatinine, Ser: 0.8 mg/dL (ref 0.44–1.00)
Glucose, Bld: 105 mg/dL — ABNORMAL HIGH (ref 70–99)
HCT: 43 % (ref 36.0–46.0)
Hemoglobin: 14.6 g/dL (ref 12.0–15.0)
Potassium: 3.7 mmol/L (ref 3.5–5.1)
Sodium: 141 mmol/L (ref 135–145)
TCO2: 26 mmol/L (ref 22–32)

## 2021-12-16 SURGERY — DILATATION & CURETTAGE/HYSTEROSCOPY WITH MYOSURE
Anesthesia: General | Site: Uterus

## 2021-12-16 MED ORDER — LIDOCAINE 2% (20 MG/ML) 5 ML SYRINGE
INTRAMUSCULAR | Status: DC | PRN
Start: 2021-12-16 — End: 2021-12-16
  Administered 2021-12-16: 80 mg via INTRAVENOUS

## 2021-12-16 MED ORDER — SODIUM CHLORIDE 0.9 % IR SOLN
Status: DC | PRN
  Administered 2021-12-16 (×2): 3000 mL

## 2021-12-16 MED ORDER — LIDOCAINE HCL (PF) 2 % IJ SOLN
INTRAMUSCULAR | Status: AC
  Filled 2021-12-16: qty 5

## 2021-12-16 MED ORDER — ONDANSETRON HCL 4 MG/2ML IJ SOLN: 4.0000 mg | Freq: Once | INTRAMUSCULAR | Status: DC | PRN

## 2021-12-16 MED ORDER — OXYCODONE HCL 5 MG PO TABS: 5.0000 mg | ORAL_TABLET | Freq: Once | ORAL | Status: DC | PRN

## 2021-12-16 MED ORDER — LEVONORGESTREL 20 MCG/DAY IU IUD: 1.0000 | INTRAUTERINE_SYSTEM | INTRAUTERINE | Status: DC

## 2021-12-16 MED ORDER — KETOROLAC TROMETHAMINE 30 MG/ML IJ SOLN
INTRAMUSCULAR | Status: AC
  Filled 2021-12-16: qty 1

## 2021-12-16 MED ORDER — DEXAMETHASONE SODIUM PHOSPHATE 10 MG/ML IJ SOLN
INTRAMUSCULAR | Status: AC
  Filled 2021-12-16: qty 1

## 2021-12-16 MED ORDER — ONDANSETRON HCL 4 MG/2ML IJ SOLN
INTRAMUSCULAR | Status: AC
  Filled 2021-12-16: qty 2

## 2021-12-16 MED ORDER — ACETAMINOPHEN 500 MG PO TABS: 1000.0000 mg | ORAL_TABLET | ORAL | Status: DC

## 2021-12-16 MED ORDER — OXYCODONE HCL 5 MG/5ML PO SOLN: 5.0000 mg | Freq: Once | ORAL | Status: DC | PRN

## 2021-12-16 MED ORDER — DEXAMETHASONE SODIUM PHOSPHATE 10 MG/ML IJ SOLN: 4.0000 mg | INTRAMUSCULAR | Status: DC

## 2021-12-16 MED ORDER — FENTANYL CITRATE (PF) 100 MCG/2ML IJ SOLN
INTRAMUSCULAR | Status: DC | PRN
  Administered 2021-12-16: 50 ug via INTRAVENOUS

## 2021-12-16 MED ORDER — ACETAMINOPHEN 500 MG PO TABS
ORAL_TABLET | ORAL | Status: AC
  Filled 2021-12-16: qty 2

## 2021-12-16 MED ORDER — LACTATED RINGERS IV SOLN: INTRAVENOUS | Status: DC

## 2021-12-16 MED ORDER — LEVONORGESTREL 20 MCG/DAY IU IUD
INTRAUTERINE_SYSTEM | INTRAUTERINE | Status: AC
  Filled 2021-12-16: qty 1

## 2021-12-16 MED ORDER — ACETAMINOPHEN 500 MG PO TABS
1000.0000 mg | ORAL_TABLET | Freq: Once | ORAL | Status: AC
  Administered 2021-12-16: 1000 mg via ORAL

## 2021-12-16 MED ORDER — PROPOFOL 10 MG/ML IV BOLUS
INTRAVENOUS | Status: DC | PRN
Start: 2021-12-16 — End: 2021-12-16
  Administered 2021-12-16: 150 mg via INTRAVENOUS

## 2021-12-16 MED ORDER — ONDANSETRON HCL 4 MG/2ML IJ SOLN
INTRAMUSCULAR | Status: DC | PRN
  Administered 2021-12-16: 4 mg via INTRAVENOUS

## 2021-12-16 MED ORDER — DEXAMETHASONE SODIUM PHOSPHATE 10 MG/ML IJ SOLN
INTRAMUSCULAR | Status: DC | PRN
  Administered 2021-12-16: 5 mg via INTRAVENOUS

## 2021-12-16 MED ORDER — FENTANYL CITRATE (PF) 100 MCG/2ML IJ SOLN: 25.0000 ug | INTRAMUSCULAR | Status: DC | PRN

## 2021-12-16 MED ORDER — FENTANYL CITRATE (PF) 100 MCG/2ML IJ SOLN
INTRAMUSCULAR | Status: AC
  Filled 2021-12-16: qty 2

## 2021-12-16 MED ORDER — AMISULPRIDE (ANTIEMETIC) 5 MG/2ML IV SOLN: 10.0000 mg | Freq: Once | INTRAVENOUS | Status: DC | PRN

## 2021-12-16 MED ORDER — KETOROLAC TROMETHAMINE 30 MG/ML IJ SOLN
INTRAMUSCULAR | Status: DC | PRN
  Administered 2021-12-16: 15 mg via INTRAVENOUS

## 2021-12-16 MED ORDER — BUPIVACAINE HCL 0.25 % IJ SOLN
INTRAMUSCULAR | Status: DC | PRN
  Administered 2021-12-16: 10 mL

## 2021-12-16 SURGICAL SUPPLY — 18 items
CATH ROBINSON RED A/P 16FR (CATHETERS) IMPLANT
DEVICE MYOSURE LITE (MISCELLANEOUS) IMPLANT
DEVICE MYOSURE REACH (MISCELLANEOUS) ×1 IMPLANT
DILATOR CANAL MILEX (MISCELLANEOUS) IMPLANT
DRSG TELFA 3X8 NADH (GAUZE/BANDAGES/DRESSINGS) ×2 IMPLANT
GAUZE 4X4 16PLY ~~LOC~~+RFID DBL (SPONGE) ×4 IMPLANT
GLOVE SURG ENC MOIS LTX SZ6 (GLOVE) ×2 IMPLANT
GLOVE SURG ENC MOIS LTX SZ6.5 (GLOVE) IMPLANT
GOWN STRL REUS W/TWL LRG LVL3 (GOWN DISPOSABLE) ×2 IMPLANT
IV NS IRRIG 3000ML ARTHROMATIC (IV SOLUTION) ×3 IMPLANT
KIT TURNOVER CYSTO (KITS) ×2 IMPLANT
PACK VAGINAL WOMENS (CUSTOM PROCEDURE TRAY) ×2 IMPLANT
PAD DRESSING TELFA 3X8 NADH (GAUZE/BANDAGES/DRESSINGS) ×1 IMPLANT
PAD OB MATERNITY 4.3X12.25 (PERSONAL CARE ITEMS) ×2 IMPLANT
PAD PREP 24X48 CUFFED NSTRL (MISCELLANEOUS) ×2 IMPLANT
SEAL ROD LENS SCOPE MYOSURE (ABLATOR) ×2 IMPLANT
TOWEL OR 17X26 10 PK STRL BLUE (TOWEL DISPOSABLE) ×2 IMPLANT
WATER STERILE IRR 500ML POUR (IV SOLUTION) ×1 IMPLANT

## 2021-12-16 NOTE — Op Note (Signed)
OPERATIVE NOTE ? ?PATIENT: Alyssa Taylor ? ?ENCOUNTER DATE: 12/16/21 ? 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 ?Postoperative Diagnosis: same as above ? ?Surgery: EUA, hysteroscopy, hysteroscopic endometrial sampling using the Myosure ? ?Surgeons:  Valarie Cones, MD ? ?Assistant: none ? ?Estimated blood loss: 25 ml ? ?IVF:  see I&O flowsheet  ? ?Urine output: n/a  ? ?Complications: None apparent ? ?Pathology: endometrial curetteings ? ?Operative findings: On EUA, 8 cm mobile uterus. On hysteroscopy, abnormal endometrium with calcified and thick tissue replacing the endometrium, somewhat limiting distention of the uterine cavity. Several small, 1-2 mm, polypoid lesions within the endometrium. ? ?Procedure: The patient was identified in the preoperative holding area. Informed consent was signed on the chart. Patient was seen history was reviewed and exam was performed.  ? ?The patient was then taken to the operating room and placed in the supine position with SCD hose on. General anesthesia was then induced without difficulty. She was then placed in the dorsolithotomy position. The perineum was prepped with Betadine. The vagina was prepped with Betadine. The patient was then draped after the prep was dried.  ? ?Timeout was performed the patient, procedure, antibiotic, allergy, and length of procedure.  ? ?The speculum was placed in the vagina. The single tooth tenaculum was placed on the anterior lip of the cervix. The uterine sound was placed in the cervix and advanced to the fundus. The cervix was successively dilated using pratts dilators to 23F. ? ?The hysteroscope was advanced into the uterine cavity with findings above. A sampling was taken of the endometrium using the Myosure Reach. This was collected in the specimen sock to be sent to pathology. ? ?The tenaculum was removed and hemostasis was observed.  ? ?The vagina was irrigated. ? ?All instrument, suture, laparotomy, Ray-Tec, and needle counts were correct x2. The  patient tolerated the procedure well and was taken recovery room in stable condition. ? ?[name], MD ? ?

## 2021-12-16 NOTE — Discharge Instructions (Addendum)
12/16/2021 ? ?Activity: ?1. Be up and out of the bed during the day.  Take a nap if needed.  ? ?2. No lifting or straining for 1 week. ? ?3. Do Not drive if you are taking narcotic pain medicine. Do not drive for at least 24 hours after anesthesia. ? ?4. Shower daily.  Use soap and water on your incision and pat dry; don't rub.  ? ?5. No sexual activity and nothing in the vagina for 2-4 weeks. No tub baths for 2-4 weeks. ? ?Medications:  ?- Take ibuprofen and tylenol as needed for cramping or pain. ? ?Diet: ?1. Low sodium Heart Healthy Diet is recommended. ? ?2. It is safe to use a laxative if you have difficulty moving your bowels.  ? ?Wound Care: ?1. Keep clean and dry.  Shower daily. ? ?Reasons to call the Doctor: ? ?Fever - Oral temperature greater than 100.4 degrees Fahrenheit ?Foul-smelling vaginal discharge ?Difficulty urinating ?Nausea and vomiting ?Difficulty breathing with or without chest pain ?New calf pain especially if only on one side ?Sudden, continuing increased vaginal bleeding with or without clots. ?  ?Follow-up: ?1. You'll have a phone visit with Dr. Berline Lopes on 3/30 as scheduled at your visit with her.  ? ?Contacts: ?For questions or concerns you should contact: ? ?Dr. Jeral Pinch at [phone] ?After hours and on week-ends call [phone] and ask to speak to the physician on call for Gynecologic Oncology ? ? ?Post Anesthesia Home Care Instructions ? ?Activity: ?Get plenty of rest for the remainder of the day. A responsible individual must stay with you for 24 hours following the procedure.  ?For the next 24 hours, DO NOT: ?-Drive a car ?-Paediatric nurse ?-Drink alcoholic beverages ?-Take any medication unless instructed by your physician ?-Make any legal decisions or sign important papers. ? ?Meals: ?Start with liquid foods such as gelatin or soup. Progress to regular foods as tolerated. Avoid greasy, spicy, heavy foods. If nausea and/or vomiting occur, drink only clear liquids until  the nausea and/or vomiting subsides. Call your physician if vomiting continues. ? ?Special Instructions/Symptoms: ?Your throat may feel dry or sore from the anesthesia or the breathing tube placed in your throat during surgery. If this causes discomfort, gargle with warm salt water. The discomfort should disappear within 24 hours. ? ?If you had a scopolamine patch placed behind your ear for the management of post- operative nausea and/or vomiting: ? ?1. The medication in the patch is effective for 72 hours, after which it should be removed.  Wrap patch in a tissue and discard in the trash. Wash hands thoroughly with soap and water. ?2. You may remove the patch earlier than 72 hours if you experience unpleasant side effects which may include dry mouth, dizziness or visual disturbances. ?3. Avoid touching the patch. Wash your hands with soap and water after contact with the patch. ?    ?Last Tylenol dose was at approximately 1:30 pm ?Last non-steroidal medication was at approximately 2:25 pm ?

## 2021-12-16 NOTE — Telephone Encounter (Signed)
Received call from Ms. Dusza this morning. Patient confirmed arrival time of 12:40 to [facility] for her surgery this afternoon. Advised patient she can have clear liquids until 11:40. Patient verbalized understanding.  ?

## 2021-12-16 NOTE — Anesthesia Postprocedure Evaluation (Signed)
Anesthesia Post Note ? ?Patient: Alyssa Taylor ? ?Procedure(s) Performed: DILATATION & CURETTAGE OF UTERUS;  HYSTEROSCOPY;  MYOSURE (Uterus) ? ?  ? ?Patient location during evaluation: PACU ?Anesthesia Type: General ?Level of consciousness: sedated ?Pain management: pain level controlled ?Vital Signs Assessment: post-procedure vital signs reviewed and stable ?Respiratory status: spontaneous breathing and respiratory function stable ?Cardiovascular status: stable ?Postop Assessment: no apparent nausea or vomiting ?Anesthetic complications: no ? ? ?No notable events documented. ? ?Last Vitals:  ?Vitals:  ? 12/16/21 1500 12/16/21 1515  ?BP: (!) 167/98 (!) 170/63  ?Pulse: (!) 50 (!) 46  ?Resp: 19   ?Temp:    ?SpO2: 98%   ?  ?Last Pain:  ?Vitals:  ? 12/16/21 1500  ?TempSrc:   ?PainSc: 0-No pain  ? ? ?  ?  ?  ?  ?  ?  ? ?Merlinda Frederick ? ? ? ? ?

## 2021-12-16 NOTE — Anesthesia Preprocedure Evaluation (Addendum)
Anesthesia Evaluation  ?Patient identified by MRN, date of birth, ID band ?Patient awake ? ? ? ?Reviewed: ?Allergy & Precautions, NPO status , Patient's Chart, lab work & pertinent test results ? ?Airway ?Mallampati: III ? ?TM Distance: >3 FB ?Neck ROM: Limited ? ? ? Dental ?no notable dental hx. ? ?  ?Pulmonary ?neg pulmonary ROS,  ?  ?Pulmonary exam normal ?breath sounds clear to auscultation ? ? ? ? ? ? Cardiovascular ?hypertension, Pt. on medications ?Normal cardiovascular exam ?Rhythm:Regular Rate:Normal ? ?Left carotid artery occlusion ?  ?Neuro/Psych ?CVA, No Residual Symptoms negative psych ROS  ? GI/Hepatic ?Neg liver ROS, hiatal hernia, GERD  ,  ?Endo/Other  ? ? Renal/GU ?negative Renal ROS  ?negative genitourinary ?  ?Musculoskeletal ? ?(+) Arthritis , Osteoarthritis,   ? Abdominal ?  ?Peds ?negative pediatric ROS ?(+)  Hematology ?negative hematology ROS ?(+)   ?Anesthesia Other Findings ?Polymyalgia rheumatica ?Chronic pain ? Reproductive/Obstetrics ?Endometrial hyperplasia ? ?  ? ? ? ? ? ? ? ? ? ? ? ? ? ?  ?  ? ? ? ? ? ? ? ?Anesthesia Physical ?Anesthesia Plan ? ?ASA: 3 ? ?Anesthesia Plan: General  ? ?Post-op Pain Management: Tylenol PO (pre-op)*  ? ?Induction: Intravenous ? ?PONV Risk Score and Plan: 3 and Treatment may vary due to age or medical condition, Dexamethasone and Ondansetron ? ?Airway Management Planned: LMA ? ?Additional Equipment: None ? ?Intra-op Plan:  ? ?Post-operative Plan: Extubation in OR ? ?Informed Consent: I have reviewed the patients History and Physical, chart, labs and discussed the procedure including the risks, benefits and alternatives for the proposed anesthesia with the patient or authorized representative who has indicated his/her understanding and acceptance.  ? ? ? ?Dental advisory given ? ?Plan Discussed with: CRNA, Anesthesiologist and Surgeon ? ?Anesthesia Plan Comments:   ? ? ? ? ? ?Anesthesia Quick Evaluation ? ?

## 2021-12-16 NOTE — Anesthesia Procedure Notes (Signed)
Procedure Name: LMA Insertion ?Date/Time: 12/16/2021 2:09 PM ?Performed by: Bonney Aid, CRNA ?Pre-anesthesia Checklist: Patient identified, Emergency Drugs available, Suction available and Patient being monitored ?Patient Re-evaluated:Patient Re-evaluated prior to induction ?Oxygen Delivery Method: Circle system utilized ?Preoxygenation: Pre-oxygenation with 100% oxygen ?Induction Type: IV induction ?Ventilation: Mask ventilation without difficulty ?LMA: LMA inserted ?LMA Size: 4.0 ?Number of attempts: 1 ?Airway Equipment and Method: Bite block ?Placement Confirmation: positive ETCO2 ?Tube secured with: Tape ?Dental Injury: Teeth and Oropharynx as per pre-operative assessment  ? ? ? ? ?

## 2021-12-16 NOTE — Interval H&P Note (Signed)
History and Physical Interval Note: ? ?12/16/2021 ?1:15 PM ? ?Alyssa Taylor  has presented today for surgery, with the diagnosis of COMPLEX ATYPICAL ENDOMETRIAL HYPERPLASIA.  The various methods of treatment have been discussed with the patient and family. After consideration of risks, benefits and other options for treatment, the patient has consented to  Procedure(s): ?DILATATION & CURETTAGE OF UTERUS; POSSIBLE HYSTEROSCOPY; POSSIBLE  MYOSURE (N/A) as a surgical intervention.  The patient's history has been reviewed, patient examined, no change in status, stable for surgery.  I have reviewed the patient's chart and labs.  Questions were answered to the patient's satisfaction.   ? ? ?[name] ? ? ?

## 2021-12-16 NOTE — Transfer of Care (Signed)
Immediate Anesthesia Transfer of Care Note ? ?Patient: Alyssa Taylor ? ?Procedure(s) Performed: DILATATION & CURETTAGE OF UTERUS;  HYSTEROSCOPY;  MYOSURE (Uterus) ? ?Patient Location: PACU ? ?Anesthesia Type:General ? ?Level of Consciousness: drowsy ? ?Airway & Oxygen Therapy: Patient Spontanous Breathing and Patient connected to nasal cannula oxygen ? ?Post-op Assessment: Report given to RN ? ?Post vital signs: Reviewed and stable ? ?Last Vitals:  ?Vitals Value Taken Time  ?BP 179/75 12/16/21 1442  ?Temp    ?Pulse 53 12/16/21 1444  ?Resp 20 12/16/21 1444  ?SpO2 100 % 12/16/21 1444  ?Vitals shown include unvalidated device data. ? ?Last Pain:  ?Vitals:  ? 12/16/21 1328  ?TempSrc: Oral  ?PainSc: 0-No pain  ?   ? ?  ? ?Complications: No notable events documented. ?

## 2021-12-17 ENCOUNTER — Encounter (HOSPITAL_BASED_OUTPATIENT_CLINIC_OR_DEPARTMENT_OTHER): Payer: Self-pay | Admitting: Gynecologic Oncology

## 2021-12-17 ENCOUNTER — Telehealth: Payer: Self-pay | Admitting: Gynecologic Oncology

## 2021-12-17 ENCOUNTER — Telehealth: Payer: Self-pay

## 2021-12-17 LAB — SURGICAL PATHOLOGY

## 2021-12-17 NOTE — Telephone Encounter (Signed)
I called the patient to review pathology results from surgery.  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  Discussed treatment options.  I recommend that we move forward with definitive surgery but if the patient would still like some time to try progesterone therapy, I think that is reasonable.  My recommendation would be that we try to get insurance approval again now that we have a cancer diagnosis for Mirena IUD.  If we cannot, then she would be on high-dose oral progesterone.  I would also recommend imaging to look at myometrial invasion as well as rule out metastatic disease.  We could do this with an MRI of her pelvis. ? ?I reviewed this information several times with the patient.  She asked questions that make me a little concerned about her understanding of what we discussed.  For instance, after I had reviewed the pathology and the difference between her initial biopsy and her recent surgical biopsy, she asked "so you did not do a comprehensive D&C?"  I explained that although we were only seeing precancer on her initial clinic biopsy, findings from that biopsy when reviewed by pathology were concerning but not diagnostic of an underlying cancer.  That was the reason I had recommended doing the D&C.  I did a comprehensive D&C as well as hysteroscopy to visualize the cavity. ? ?I suggested that the patient take some time to discuss these results with her husband and think about how she would like to proceed.  I have asked her to call my clinic either tomorrow or early next week when she is ready to make a decision about her treatment plan. ? ?Jeral Pinch MD ?Gynecologic Oncology ? ?

## 2021-12-17 NOTE — Telephone Encounter (Signed)
Spoke with Ms. Newbern this afternoon. She states she is eating, drinking and urinating well. She has not had a BM yet and is not passing gas.  Encouraged her to drink plenty of water and be up and moving . She denies fever or chills.  She rates her pain 0/10. She has not needed to take any pain medication.  ? ?Patient reports she spoke with Dr. Berline Lopes earlier. Asked patient if she had any questions but she denied having any.  ? ?Instructed to call office with any fever, chills, purulent drainage, uncontrolled pain or any other questions or concerns. Patient verbalizes understanding.  ? ?Pt aware of post op appointments as well as the office number [phone] and after hours number [phone] to call if she has any questions or concerns  ?

## 2021-12-17 NOTE — Telephone Encounter (Signed)
Attempted to reach patient to check in with her post-operatively. Unable to reach patient. Left message requesting return call.   

## 2021-12-18 ENCOUNTER — Other Ambulatory Visit (HOSPITAL_COMMUNITY): Payer: Self-pay

## 2021-12-21 ENCOUNTER — Telehealth: Payer: Self-pay | Admitting: *Deleted

## 2021-12-21 NOTE — Telephone Encounter (Signed)
Following up with Alyssa Taylor this afternoon and reviewed ultrasound results. Per Dr. Berline Lopes the ultrasound does not show unexpected findings. Thickened lining is what we expect with her cancer diagnosis. Patient verbalizes understanding. ? ?Patient states "I would like to have the surgery and have the hysterectomy." ? ?Advised patient that our office will look at surgical dates and be in contact shortly for surgery scheduling. Patient verbalized understanding.  ?

## 2021-12-21 NOTE — Telephone Encounter (Signed)
Patient called and left a message that she does want to proceed with a hysterectomy surgery. Message forwarded to [facility] APP  ?

## 2021-12-21 NOTE — Telephone Encounter (Signed)
Reached out to patient to review possible surgical dates. Unable to contact patient. Left message requesting return call.  ? ?Surgical dates available include April 18th and 19th. Patient will need a pre-op appointment with [name], NP on a day that Dr. Berline Lopes has clinic.  ?

## 2021-12-22 ENCOUNTER — Telehealth: Payer: Self-pay | Admitting: *Deleted

## 2021-12-22 NOTE — Telephone Encounter (Signed)
Attempted to speak with pt regarding scheduling surgery and pre op with [name], NP. Unable to reach pt. LVM for call back.  ? ?Surgery date availability April 18 or 19.  ?

## 2021-12-23 NOTE — Telephone Encounter (Signed)
Spoke with Ms. Rumbold this afternoon. Per patient request reviewed ultrasound results with her again. Per Dr. Berline Lopes the ultrasound does not show unexpected findings. Thickened lining is what we expect with her cancer diagnosis. Patient verbalizes understanding. ? ?Offered patient surgical dates of April 18th or April 19th. Patient would like to proceed with surgery on April 18th.   ? ?Pre-op appointment scheduled with [name], NP on 01/04/22 at 2 pm. Patient is in agreement of appointment date and time. Advised patient that she will also be receiving a call from the hospital to setup a pre-admissions appointment. Patient verbalized understanding. Instructed to call with any questions or concerns.  ?

## 2021-12-24 ENCOUNTER — Inpatient Hospital Stay: Payer: Medicare Other | Admitting: Gynecologic Oncology

## 2021-12-29 ENCOUNTER — Telehealth: Payer: Self-pay | Admitting: *Deleted

## 2021-12-29 NOTE — Telephone Encounter (Signed)
Per Dr Berline Lopes sent office note and surgical optimization form to patient's PCP  ?

## 2021-12-29 NOTE — Progress Notes (Addendum)
COVID Vaccine Completed: no ?Date COVID Vaccine completed: ?Has received booster: ?COVID vaccine manufacturer: [place]  ? ?Date of COVID positive in last 90 days: no ? ?PCP - Thedora Hinders, FNP ?Cardiologist - n/a ? ?Chest x-ray - n/a ?EKG - 12/16/21 Epic ?Stress Test - n/a ?ECHO - 12/07/20 CE ?Cardiac Cath - n/a ?Pacemaker/ICD device last checked: n/a ?Spinal Cord Stimulator: n/a ? ?Bowel Prep - light diet day before ? ?Sleep Study - n/a ?CPAP -  ? ?Fasting Blood Sugar - pre, pt denies. No checks at home or medications ?Checks Blood Sugar _____ times a day ? ?Blood Thinner Instructions:  ?Aspirin Instructions:ASA 81, no instructions, pt will call PCP ?Last Dose: ? ?Activity level: Can go up a flight of stairs and perform activities of daily living without stopping and without symptoms of chest pain. SOB with exertion.   ? ?Anesthesia review:  ? ?Patient denies shortness of breath, fever, cough and chest pain at PAT appointment ? ? ?Patient verbalized understanding of instructions that were given to them at the PAT appointment. Patient was also instructed that they will need to review over the PAT instructions again at home before surgery.  ?

## 2021-12-29 NOTE — Patient Instructions (Addendum)
DUE TO COVID-19 ONLY ONE VISITOR  (aged 80 and older)  IS ALLOWED TO COME WITH YOU AND STAY IN THE WAITING ROOM ONLY DURING PRE OP AND PROCEDURE.   ?**NO VISITORS ARE ALLOWED IN THE SHORT STAY AREA OR RECOVERY ROOM!!** ? ? Your procedure is scheduled on: 01/12/22 ? ? Report to [facility] Main Entrance ? ?  Report to admitting at 11:45 AM ? ? Call this number if you have problems the morning of surgery [phone] ? ? Do not eat food :After Midnight. ? ? After Midnight you may have the following liquids until 11:00 AM DAY OF SURGERY ? ?Water ?Black Coffee (sugar ok, NO MILK/CREAM OR CREAMERS)  ?Tea (sugar ok, NO MILK/CREAM OR CREAMERS) regular and decaf                             ?Plain Jell-O (NO RED)                                           ?Fruit ices (not with fruit pulp, NO RED)                                     ?Popsicles (NO RED)                                                                  ?Juice: apple, WHITE grape, WHITE cranberry ?Sports drinks like Gatorade (NO RED) ?Clear broth(vegetable,chicken,beef) ? ?              ?The day of surgery:  ?Drink ONE (1) Pre-Surgery G2 at 11:00 AM the morning of surgery. Drink in one sitting. Do not sip.  ?This drink was given to you during your hospital  ?pre-op appointment visit. ?Nothing else to drink after completing the  ?Pre-Surgery G2. ?  ?       If you have questions, please contact your surgeon?s office. ? ? ?FOLLOW BOWEL PREP AND ANY ADDITIONAL PRE OP INSTRUCTIONS YOU RECEIVED FROM YOUR SURGEON'S OFFICE!!! ?  ?  ?Oral Hygiene is also important to reduce your risk of infection.                                    ?Remember - BRUSH YOUR TEETH THE MORNING OF SURGERY WITH YOUR REGULAR TOOTHPASTE ? ? Take these medicines the morning of surgery with A SIP OF WATER: None ?                  ?           You may not have any metal on your body including hair pins, jewelry, and body piercing ? ?           Do not wear make-up, lotions, powders, perfumes, or  deodorant ? ?Do not wear nail polish including gel and S&S, artificial/acrylic nails, or any other type of covering on natural nails including finger and toenails. If you have  artificial nails, gel coating, etc. that needs to be removed by a nail salon please have this removed prior to surgery or surgery may need to be canceled/ delayed if the surgeon/ anesthesia feels like they are unable to be safely monitored.  ? ?Do not shave  48 hours prior to surgery.  ? ? Do not bring valuables to the hospital. [employer] NOT ?            RESPONSIBLE   FOR VALUABLES. ?  ? Patients discharged on the day of surgery will not be allowed to drive home.  Someone NEEDS to stay with you for the first 24 hours after anesthesia. ? ? Special Instructions: Bring a copy of your healthcare power of attorney and living will documents         the day of surgery if you haven't scanned them before. ? ?            Please read over the following fact sheets you were given: IF [place] [phone]- Apolonio Schneiders ? ?    - Preparing for Surgery ?Before surgery, you can play an important role.  Because skin is not sterile, your skin needs to be as free of germs as possible.  You can reduce the number of germs on your skin by washing with CHG (chlorahexidine gluconate) soap before surgery.  CHG is an antiseptic cleaner which kills germs and bonds with the skin to continue killing germs even after washing. ?Please DO NOT use if you have an allergy to CHG or antibacterial soaps.  If your skin becomes reddened/irritated stop using the CHG and inform your nurse when you arrive at Short Stay. ?Do not shave (including legs and underarms) for at least 48 hours prior to the first CHG shower.  You may shave your face/neck. ? ?Please follow these instructions carefully: ? 1.  Shower with CHG Soap the night before surgery and the  morning of surgery. ? 2.  If you choose to wash your hair, wash your  hair first as usual with your normal  shampoo. ? 3.  After you shampoo, rinse your hair and body thoroughly to remove the shampoo.                            ? 4.  Use CHG as you would any other liquid soap.  You can apply chg directly to the skin and wash.  Gently with a scrungie or clean washcloth. ? 5.  Apply the CHG Soap to your body ONLY FROM THE NECK DOWN.   Do   not use on face/ open      ?                     Wound or open sores. Avoid contact with eyes, ears mouth and   genitals (private parts).  ?                     Production manager,  Genitals (private parts) with your normal soap. ?            6.  Wash thoroughly, paying special attention to the area where your    surgery  will be performed. ? 7.  Thoroughly rinse your body with warm water from the neck down. ? 8.  DO NOT shower/wash with your normal soap after using and rinsing off the CHG  Soap. ?               9.  Pat yourself dry with a clean towel. ?           10.  Wear clean pajamas. ?           11.  Place clean sheets on your bed the night of your first shower and do not  sleep with pets. ?Day of Surgery : ?Do not apply any lotions/deodorants the morning of surgery.  Please wear clean clothes to the hospital/surgery center. ? ?FAILURE TO FOLLOW THESE INSTRUCTIONS MAY RESULT IN THE CANCELLATION OF YOUR SURGERY ? ?PATIENT SIGNATURE_________________________________ ? ?NURSE SIGNATURE__________________________________ ? ?________________________________________________________________________  ? ?Incentive Spirometer ? ?An incentive spirometer is a tool that can help keep your lungs clear and active. This tool measures how well you are filling your lungs with each breath. Taking long deep breaths may help reverse or decrease the chance of developing breathing (pulmonary) problems (especially infection) following: ?A long period of time when you are unable to move or be active. ?BEFORE THE PROCEDURE  ?If the spirometer includes an indicator to show your best  effort, your nurse or respiratory therapist will set it to a desired goal. ?If possible, sit up straight or lean slightly forward. Try not to slouch. ?Hold the incentive spirometer in an upright position. ?INSTRUCTIONS FOR USE  ?Sit on the edge of your bed if possible, or sit up as far as you can in bed or on a chair. ?Hold the incentive spirometer in an upright position. ?Breathe out normally. ?Place the mouthpiece in your mouth and seal your lips tightly around it. ?Breathe in slowly and as deeply as possible, raising the piston or the ball toward the top of the column. ?Hold your breath for 3-5 seconds or for as long as possible. Allow the piston or ball to fall to the bottom of the column. ?Remove the mouthpiece from your mouth and breathe out normally. ?Rest for a few seconds and repeat Steps 1 through 7 at least 10 times every 1-2 hours when you are awake. Take your time and take a few normal breaths between deep breaths. ?The spirometer may include an indicator to show your best effort. Use the indicator as a goal to work toward during each repetition. ?After each set of 10 deep breaths, practice coughing to be sure your lungs are clear. If you have an incision (the cut made at the time of surgery), support your incision when coughing by placing a pillow or rolled up towels firmly against it. ?Once you are able to get out of bed, walk around indoors and cough well. You may stop using the incentive spirometer when instructed by your caregiver.  ?RISKS AND COMPLICATIONS ?Take your time so you do not get dizzy or light-headed. ?If you are in pain, you may need to take or ask for pain medication before doing incentive spirometry. It is harder to take a deep breath if you are having pain. ?AFTER USE ?Rest and breathe slowly and easily. ?It can be helpful to keep track of a log of your progress. Your caregiver can provide you with a simple table to help with this. ?If you are using the spirometer at home, follow  these instructions: ?SEEK MEDICAL CARE IF:  ?You are having difficultly using the spirometer. ?You have trouble using the spirometer as often as instructed. ?Your pain medication is not giving enough rel

## 2021-12-30 ENCOUNTER — Encounter (HOSPITAL_COMMUNITY)
Admission: RE | Admit: 2021-12-30 | Discharge: 2021-12-30 | Disposition: A | Discharge status: Home / Self Care | Payer: Medicare Other | Source: Ambulatory Visit | Attending: Gynecologic Oncology | Admitting: Gynecologic Oncology

## 2021-12-30 ENCOUNTER — Encounter (HOSPITAL_COMMUNITY): Payer: Self-pay

## 2021-12-30 VITALS — HR 55 | Temp 98.0°F | Resp 14 | Ht 61.0 in | Wt 202.8 lb

## 2021-12-30 DIAGNOSIS — R7303 Prediabetes: Secondary | ICD-10-CM | POA: Insufficient documentation

## 2021-12-30 DIAGNOSIS — C541 Malignant neoplasm of endometrium: Secondary | ICD-10-CM | POA: Insufficient documentation

## 2021-12-30 DIAGNOSIS — Z01818 Encounter for other preprocedural examination: Secondary | ICD-10-CM

## 2021-12-30 DIAGNOSIS — Z01812 Encounter for preprocedural laboratory examination: Secondary | ICD-10-CM | POA: Diagnosis present

## 2021-12-30 LAB — COMPREHENSIVE METABOLIC PANEL
ALT: 13 U/L (ref 0–44)
AST: 14 U/L — ABNORMAL LOW (ref 15–41)
Albumin: 3.9 g/dL (ref 3.5–5.0)
Alkaline Phosphatase: 50 U/L (ref 38–126)
Anion gap: 4 — ABNORMAL LOW (ref 5–15)
BUN: 20 mg/dL (ref 8–23)
CO2: 26 mmol/L (ref 22–32)
Calcium: 8.8 mg/dL — ABNORMAL LOW (ref 8.9–10.3)
Chloride: 111 mmol/L (ref 98–111)
Creatinine, Ser: 0.82 mg/dL (ref 0.44–1.00)
GFR, Estimated: >60 mL/min (ref >60)
Glucose, Bld: 114 mg/dL — ABNORMAL HIGH (ref 70–99)
Potassium: 4.2 mmol/L (ref 3.5–5.1)
Sodium: 141 mmol/L (ref 135–145)
Total Bilirubin: 0.5 mg/dL (ref 0.3–1.2)
Total Protein: 6.6 g/dL (ref 6.5–8.1)

## 2021-12-30 LAB — CBC
HCT: 40.9 % (ref 36.0–46.0)
Hemoglobin: 13.2 g/dL (ref 12.0–15.0)
MCH: 30.5 pg (ref 26.0–34.0)
MCHC: 32.3 g/dL (ref 30.0–36.0)
MCV: 94.5 fL (ref 80.0–100.0)
Platelets: 309 10*3/uL (ref 150–400)
RBC: 4.33 MIL/uL (ref 3.87–5.11)
RDW: 13.5 % (ref 11.5–15.5)
WBC: 7.3 10*3/uL (ref 4.0–10.5)
nRBC: 0 % (ref 0.0–0.2)

## 2021-12-30 LAB — TYPE AND SCREEN
ABO/RH(D): O POS
Antibody Screen: NEGATIVE

## 2021-12-30 LAB — GLUCOSE, CAPILLARY: Glucose-Capillary: 141 mg/dL — ABNORMAL HIGH (ref 70–99)

## 2021-12-31 LAB — HEMOGLOBIN A1C
Hgb A1c MFr Bld: 6.1 % — ABNORMAL HIGH (ref 4.8–5.6)
Mean Plasma Glucose: 128 mg/dL

## 2022-01-04 ENCOUNTER — Inpatient Hospital Stay: Payer: Medicare Other | Attending: Gynecologic Oncology | Admitting: Gynecologic Oncology

## 2022-01-04 NOTE — Patient Instructions (Addendum)
Preparing for your Surgery ? ?Plan for surgery on January 12, 2022 with Dr. Jeral Pinch at [place] will be scheduled for robotic assisted total laparoscopic hysterectomy (removal of the uterus and cervix), bilateral salpingo-oophorectomy (removal of both ovaries and fallopian tubes), sentinel lymph node biopsy, possible lymph node dissection, possible laparotomy (larger incision on your abdomen if needed). ? ?Pre-operative Testing ?-You will receive a phone call from presurgical testing at [facility] to arrange for a pre-operative appointment and lab work. ? ?-Bring your insurance card, copy of an advanced directive if applicable, medication list ? ?-At that visit, you will be asked to sign a consent for a possible blood transfusion in case a transfusion becomes necessary during surgery.  The need for a blood transfusion is rare but having consent is a necessary part of your care.    ? ?-You can keep taking your baby aspirin (81 mg) with your last dose being the day before surgery. ? ?-PLEASE STOP YOUR SUPPLEMENTS NOW. Do not take supplements such as fish oil (omega 3), red yeast rice, turmeric before your surgery. You want to avoid medications with aspirin in them including headache powders such as BC or Goody's), Excedrin migraine. ? ?Day Before Surgery at Home ?-You will be asked to take in a light diet the day before surgery. You will be advised you can have clear liquids up until 3 hours before your surgery.   ? ?Eat a light diet the day before surgery.  Examples including soups, broths, toast, yogurt, mashed potatoes.  AVOID GAS PRODUCING FOODS. Things to avoid include carbonated beverages (fizzy beverages, sodas), raw fruits and raw vegetables (uncooked), or beans.  ? ?If your bowels are filled with gas, your surgeon will have difficulty visualizing your pelvic organs which increases your surgical risks. ? ?Your role in recovery ?Your role is to become active as soon as directed  by your doctor, while still giving yourself time to heal.  Rest when you feel tired. You will be asked to do the following in order to speed your recovery: ? ?- Cough and breathe deeply. This helps to clear and expand your lungs and can prevent pneumonia after surgery.  ?- STAY ACTIVE WHEN YOU GET HOME. Do mild physical activity. Walking or moving your legs help your circulation and body functions return to normal. Do not try to get up or walk alone the first time after surgery.   ?-If you develop swelling on one leg or the other, pain in the back of your leg, redness/warmth in one of your legs, please call the office or go to the Emergency Room to have a doppler to rule out a blood clot. For shortness of breath, chest pain-seek care in the Emergency Room as soon as possible. ?- Actively manage your pain. Managing your pain lets you move in comfort. We will ask you to rate your pain on a scale of zero to 10. It is your responsibility to tell your doctor or nurse where and how much you hurt so your pain can be treated. ? ?Special Considerations ?-If you are diabetic, you may be placed on insulin after surgery to have closer control over your blood sugars to promote healing and recovery.  This does not mean that you will be discharged on insulin.  If applicable, your oral antidiabetics will be resumed when you are tolerating a solid diet. ? ?-Your final pathology results from surgery should be available around one week after surgery and the results  will be relayed to you when available. ? ?-FMLA forms can be faxed to [phone] and please allow 5-7 business days for completion. ? ?Pain Management After Surgery ?-You have been prescribed your pain medication and bowel regimen medications before surgery so that you can have these available when you are discharged from the hospital. The pain medication is for use ONLY AFTER surgery and a new prescription will not be given.  ? ?-Make sure that you have Tylenol and  Ibuprofen IF YOU ARE ABLE TO TAKE THESE MEDICATIONS at home to use on a regular basis after surgery for pain control. We recommend alternating the medications every hour to six hours since they work differently and are processed in the body differently for pain relief. ? ?-Review the attached handout on narcotic use and their risks and side effects.  ? ?Bowel Regimen ?-You have been prescribed Sennakot-S to take nightly to prevent constipation especially if you are taking the narcotic pain medication intermittently.  It is important to prevent constipation and drink adequate amounts of liquids. You can stop taking this medication when you are not taking pain medication and you are back on your normal bowel routine. ? ?Risks of Surgery ?Risks of surgery are low but include bleeding, infection, damage to surrounding structures, re-operation, blood clots, and very rarely death. ? ? ?Blood Transfusion Information (For the consent to be signed before surgery) ? ?We will be checking your blood type before surgery so in case of emergencies, we will know what type of blood you would need. ? ?                                          WHAT IS A BLOOD TRANSFUSION? ? ?A transfusion is the replacement of blood or some of its parts. Blood is made up of multiple cells which provide different functions. ?Red blood cells carry oxygen and are used for blood loss replacement. ?White blood cells fight against infection. ?Platelets control bleeding. ?Plasma helps clot blood. ?Other blood products are available for specialized needs, such as hemophilia or other clotting disorders. ?BEFORE THE TRANSFUSION  ?Who gives blood for transfusions?  ?You may be able to donate blood to be used at a later date on yourself (autologous donation). ?Relatives can be asked to donate blood. This is generally not any safer than if you have received blood from a stranger. The same precautions are taken to ensure safety when a relative's blood is  donated. ?Healthy volunteers who are fully evaluated to make sure their blood is safe. This is blood bank blood. ?Transfusion therapy is the safest it has ever been in the practice of medicine. Before blood is taken from a donor, a complete history is taken to make sure that person has no history of diseases nor engages in risky social behavior (examples are intravenous drug use or sexual activity with multiple partners). The donor's travel history is screened to minimize risk of transmitting infections, such as malaria. The donated blood is tested for signs of infectious diseases, such as HIV and hepatitis. The blood is then tested to be sure it is compatible with you in order to minimize the chance of a transfusion reaction. If you or a relative donates blood, this is often done in anticipation of surgery and is not appropriate for emergency situations. It takes many days to process the donated blood. ?RISKS AND COMPLICATIONS ?Although  transfusion therapy is very safe and saves many lives, the main dangers of transfusion include:  ?Getting an infectious disease. ?Developing a transfusion reaction. This is an allergic reaction to something in the blood you were given. Every precaution is taken to prevent this. ?The decision to have a blood transfusion has been considered carefully by your caregiver before blood is given. Blood is not given unless the benefits outweigh the risks. ? ?AFTER SURGERY INSTRUCTIONS ? ?Return to work: 4-6 weeks if applicable ? ?Activity: ?1. Be up and out of the bed during the day.  Take a nap if needed.  You may walk up steps but be careful and use the hand rail.  Stair climbing will tire you more than you think, you may need to stop part way and rest.  ? ?2. No lifting or straining for 6 weeks over 10 pounds. No pushing, pulling, straining for 6 weeks. ? ?3. No driving for around 1 week(s) if you were cleared to drive before surgery.  Do not drive if you are taking narcotic pain medicine  and make sure that your reaction time has returned.  ? ?4. You can shower as soon as the next day after surgery. Shower daily.  Use your regular soap and water (not directly on the incision) and pat your incision(

## 2022-01-05 ENCOUNTER — Telehealth: Payer: Self-pay | Admitting: *Deleted

## 2022-01-05 NOTE — Progress Notes (Signed)
Patient no showed to preop appt with [name] NP on 01/04/22.This encounter was created in error - please disregard. ?

## 2022-01-05 NOTE — Telephone Encounter (Signed)
Called and left the patient a message to call the office back regarding her missed appt from yesterday  ?

## 2022-01-06 NOTE — Telephone Encounter (Signed)
Spoke with the patient and her PCP medical clearance appt is today at 12 pm  ?

## 2022-01-06 NOTE — Telephone Encounter (Signed)
Spoke with the patient and she thought her appt with Melissa APP was today. Explained that Douglas APP was in the OR but the office would call her back with an appt for possible this afternoon  ?

## 2022-01-07 ENCOUNTER — Encounter: Payer: Self-pay | Admitting: Gynecologic Oncology

## 2022-01-07 NOTE — Progress Notes (Signed)
Patient here with family for a pre-operative appointment prior to her scheduled surgery on January 12, 2022. She is scheduled for robotic assisted total laparoscopic hysterectomy, bilateral salpingo-oophorectomy, sentinel lymph node biopsy, possible lymph node dissection, possible laparotomy.  She had her pre-admission testing appointment on 12/30/21 at [facility].  The surgery was discussed in detail.  See after visit summary for additional details. Visual aids used to discuss items related to surgery including the incentive spirometer (given to pt), sequential compression stockings, foley catheter, IV pump, multi-modal pain regimen including tylenol, photo of the surgical robot, female reproductive system to discuss surgery in detail.    ?  ?Discussed post-op pain management in detail including the aspects of the enhanced recovery pathway.  Advised her that a new prescription would be sent in for tramadol and it is only to be used for after her upcoming surgery.  We discussed the use of tylenol post-op and to monitor for a maximum of 4,000 mg in a 24 hour period.  Also prescribed sennakot to be used after surgery and to hold if having loose stools.  Discussed bowel regimen in detail.   ?  ?Discussed the use of SCDs and measures to take at home to prevent DVT including frequent mobility.  Reportable signs and symptoms of DVT discussed. Post-operative instructions discussed and expectations for after surgery. Incisional care discussed as well including reportable signs and symptoms including erythema, drainage, wound separation.  ? ?Pre-op labs reviewed along with pathology from recent d and c with Dr. Berline Lopes.  ?   ?10 minutes spent with the patient.  Verbalizing understanding of material discussed. No needs or concerns voiced at the end of the visit.   Advised patient and family to call for any needs.  Advised that her post-operative medications had been prescribed and could be picked up at any time.   ? ?This  appointment is included in the global surgical bundle as pre-operative teaching and has no charge.     ?

## 2022-01-07 NOTE — Telephone Encounter (Signed)
Received call from Ms. Chappuis this morning. Patient missed her pre-op appointment. Appointment rescheduled for 01/08/22 at 1:15 with [name], NP.  ?

## 2022-01-07 NOTE — Patient Instructions (Signed)
Preparing for your Surgery ? ?Plan for surgery on January 12, 2022 with Dr. Jeral Pinch at [place] will be scheduled for robotic assisted total laparoscopic hysterectomy (removal of the uterus and cervix), bilateral salpingo-oophorectomy (removal of both ovaries and fallopian tubes), sentinel lymph node biopsy, possible lymph node dissection, possible laparotomy (larger incision on your abdomen if needed).  ? ?Pre-operative Testing ?-You will receive a phone call from presurgical testing at [facility] to arrange for a pre-operative appointment and lab work. ? ?-Bring your insurance card, copy of an advanced directive if applicable, medication list ? ?-At that visit, you will be asked to sign a consent for a possible blood transfusion in case a transfusion becomes necessary during surgery.  The need for a blood transfusion is rare but having consent is a necessary part of your care.    ? ?-You should not be taking blood thinners or aspirin at least ten days prior to surgery unless instructed by your surgeon. ? ?-You need to stop taking your supplements now. Do not take supplements such as fish oil (omega 3), red yeast rice, turmeric before your surgery. You want to avoid medications with aspirin in them including headache powders such as BC or Goody's), Excedrin migraine. ? ?Day Before Surgery at Home ?-You will be asked to take in a light diet the day before surgery. You will be advised you can have clear liquids up until 3 hours before your surgery.   ? ?Eat a light diet the day before surgery.  Examples including soups, broths, toast, yogurt, mashed potatoes.  AVOID GAS PRODUCING FOODS. Things to avoid include carbonated beverages (fizzy beverages, sodas), raw fruits and raw vegetables (uncooked), or beans.  ? ?If your bowels are filled with gas, your surgeon will have difficulty visualizing your pelvic organs which increases your surgical risks. ? ?Your role in recovery ?Your role  is to become active as soon as directed by your doctor, while still giving yourself time to heal.  Rest when you feel tired. You will be asked to do the following in order to speed your recovery: ? ?- Cough and breathe deeply. This helps to clear and expand your lungs and can prevent pneumonia after surgery.  ?- STAY ACTIVE WHEN YOU GET HOME. Do mild physical activity. Walking or moving your legs help your circulation and body functions return to normal. Do not try to get up or walk alone the first time after surgery.   ?-If you develop swelling on one leg or the other, pain in the back of your leg, redness/warmth in one of your legs, please call the office or go to the Emergency Room to have a doppler to rule out a blood clot. For shortness of breath, chest pain-seek care in the Emergency Room as soon as possible. ?- Actively manage your pain. Managing your pain lets you move in comfort. We will ask you to rate your pain on a scale of zero to 10. It is your responsibility to tell your doctor or nurse where and how much you hurt so your pain can be treated. ? ?Special Considerations ?-If you are diabetic, you may be placed on insulin after surgery to have closer control over your blood sugars to promote healing and recovery.  This does not mean that you will be discharged on insulin.  If applicable, your oral antidiabetics will be resumed when you are tolerating a solid diet. ? ?-Your final pathology results from surgery should be available around  one week after surgery and the results will be relayed to you when available. ? ?-FMLA forms can be faxed to [phone] and please allow 5-7 business days for completion. ? ?Pain Management After Surgery ?-You have been prescribed your pain medication and bowel regimen medications before surgery so that you can have these available when you are discharged from the hospital. The pain medication is for use ONLY AFTER surgery and a new prescription will not be given.   ? ?-Make sure that you have Tylenol and Ibuprofen IF YOU ARE ABLE TO TAKE THESE MEDICATIONS at home to use on a regular basis after surgery for pain control. We recommend alternating the medications every hour to six hours since they work differently and are processed in the body differently for pain relief. ? ?-Review the attached handout on narcotic use and their risks and side effects.  ? ?Bowel Regimen ?-You have been prescribed Sennakot-S to take nightly to prevent constipation especially if you are taking the narcotic pain medication intermittently.  It is important to prevent constipation and drink adequate amounts of liquids. You can stop taking this medication when you are not taking pain medication and you are back on your normal bowel routine. ? ?Risks of Surgery ?Risks of surgery are low but include bleeding, infection, damage to surrounding structures, re-operation, blood clots, and very rarely death. ? ? ?Blood Transfusion Information (For the consent to be signed before surgery) ? ?We will be checking your blood type before surgery so in case of emergencies, we will know what type of blood you would need. ? ?                                          WHAT IS A BLOOD TRANSFUSION? ? ?A transfusion is the replacement of blood or some of its parts. Blood is made up of multiple cells which provide different functions. ?Red blood cells carry oxygen and are used for blood loss replacement. ?White blood cells fight against infection. ?Platelets control bleeding. ?Plasma helps clot blood. ?Other blood products are available for specialized needs, such as hemophilia or other clotting disorders. ?BEFORE THE TRANSFUSION  ?Who gives blood for transfusions?  ?You may be able to donate blood to be used at a later date on yourself (autologous donation). ?Relatives can be asked to donate blood. This is generally not any safer than if you have received blood from a stranger. The same precautions are taken to ensure  safety when a relative's blood is donated. ?Healthy volunteers who are fully evaluated to make sure their blood is safe. This is blood bank blood. ?Transfusion therapy is the safest it has ever been in the practice of medicine. Before blood is taken from a donor, a complete history is taken to make sure that person has no history of diseases nor engages in risky social behavior (examples are intravenous drug use or sexual activity with multiple partners). The donor's travel history is screened to minimize risk of transmitting infections, such as malaria. The donated blood is tested for signs of infectious diseases, such as HIV and hepatitis. The blood is then tested to be sure it is compatible with you in order to minimize the chance of a transfusion reaction. If you or a relative donates blood, this is often done in anticipation of surgery and is not appropriate for emergency situations. It takes many days to process  the donated blood. ?RISKS AND COMPLICATIONS ?Although transfusion therapy is very safe and saves many lives, the main dangers of transfusion include:  ?Getting an infectious disease. ?Developing a transfusion reaction. This is an allergic reaction to something in the blood you were given. Every precaution is taken to prevent this. ?The decision to have a blood transfusion has been considered carefully by your caregiver before blood is given. Blood is not given unless the benefits outweigh the risks. ? ?AFTER SURGERY INSTRUCTIONS ? ?Return to work: 4-6 weeks if applicable ? ?Activity: ?1. Be up and out of the bed during the day.  Take a nap if needed.  You may walk up steps but be careful and use the hand rail.  Stair climbing will tire you more than you think, you may need to stop part way and rest.  ? ?2. No lifting or straining for 6 weeks over 10 pounds. No pushing, pulling, straining for 6 weeks. ? ?3. No driving for around 1 week(s).  Do not drive if you are taking narcotic pain medicine and make  sure that your reaction time has returned.  ? ?4. You can shower as soon as the next day after surgery. Shower daily.  Use your regular soap and water (not directly on the incision) and pat your incision(s) dry

## 2022-01-08 ENCOUNTER — Other Ambulatory Visit: Payer: Self-pay

## 2022-01-08 ENCOUNTER — Inpatient Hospital Stay (HOSPITAL_BASED_OUTPATIENT_CLINIC_OR_DEPARTMENT_OTHER): Payer: Medicare Other | Admitting: Gynecologic Oncology

## 2022-01-08 VITALS — BP 185/60 | HR 52 | Temp 98.8°F | Resp 18 | Ht 61.0 in | Wt 201.0 lb

## 2022-01-08 DIAGNOSIS — C541 Malignant neoplasm of endometrium: Secondary | ICD-10-CM

## 2022-01-08 DIAGNOSIS — N8501 Benign endometrial hyperplasia: Secondary | ICD-10-CM

## 2022-01-08 MED ORDER — TRAMADOL HCL 50 MG PO TABS: 50.0000 mg | ORAL_TABLET | Freq: Four times a day (QID) | ORAL | 0 refills | Status: DC | PRN

## 2022-01-08 MED ORDER — SENNOSIDES-DOCUSATE SODIUM 8.6-50 MG PO TABS: 2.0000 | ORAL_TABLET | Freq: Every day | ORAL | 0 refills | Status: DC

## 2022-01-11 ENCOUNTER — Telehealth: Payer: Self-pay | Admitting: *Deleted

## 2022-01-11 NOTE — Telephone Encounter (Signed)
Attempted to speak to pt today by calling her cell phone and also called husband's cell phone. Unable to reach neither party. Unable to leave voice message on his phone.  ?

## 2022-01-11 NOTE — Telephone Encounter (Signed)
Attempted to call pt to review pre op information. Unable to reach pt. LVM for return call.  ?

## 2022-01-11 NOTE — Telephone Encounter (Signed)
Attempted to speak to pt this afternoon. Unable to reach pt on her cell phone and her home phone.  ?

## 2022-01-12 ENCOUNTER — Telehealth: Payer: Self-pay | Admitting: *Deleted

## 2022-01-12 ENCOUNTER — Encounter (HOSPITAL_COMMUNITY): Admission: RE | Payer: Self-pay | Source: Ambulatory Visit

## 2022-01-12 ENCOUNTER — Ambulatory Visit (HOSPITAL_COMMUNITY): Admission: RE | Admit: 2022-01-12 | Payer: Medicare Other | Source: Ambulatory Visit | Admitting: Gynecologic Oncology

## 2022-01-12 DIAGNOSIS — R7303 Prediabetes: Secondary | ICD-10-CM

## 2022-01-12 DIAGNOSIS — C541 Malignant neoplasm of endometrium: Secondary | ICD-10-CM

## 2022-01-12 SURGERY — HYSTERECTOMY, TOTAL, ROBOT-ASSISTED, LAPAROSCOPIC, WITH BILATERAL SALPINGO-OOPHORECTOMY
Anesthesia: General

## 2022-01-12 NOTE — Telephone Encounter (Signed)
Pt called in this morning stating that she does not want the surgery today because she has been talking to her family members and they do not want her to have the surgery. She wants more time to think about it and maybe reschedule 6 months later but she will give [place] a call.  ?

## 2022-01-12 NOTE — Telephone Encounter (Signed)
Pt called in and left a message at 5:21pm 01/12/20 stating that she wants to cancel her surgery today. Attempted to call pt back this morning unable to reach her. Unable to reach husband and sister who is the emergency contact as well. LVM on pt's phone and sister's phone for a call back. Called over to short stay and they stated that she had not called them.  ?

## 2022-01-19 ENCOUNTER — Telehealth: Payer: Medicare Other | Admitting: Gynecologic Oncology

## 2022-01-21 ENCOUNTER — Telehealth: Payer: Self-pay | Admitting: *Deleted

## 2022-01-21 NOTE — Telephone Encounter (Signed)
Attempted to speak with pt today. Unable to reach her. LVM for return call.  ?

## 2022-01-22 NOTE — Telephone Encounter (Signed)
Attempted to contact patient regarding her decision to delay/not have surgery. Her home number [phone] is not in service. Unable to reach patient on her cell. Left message requesting return call.  ?

## 2022-01-25 NOTE — Telephone Encounter (Signed)
Attempted to reach pt. Unable to reach her. LVM for return call.  ?

## 2022-02-01 ENCOUNTER — Encounter: Payer: Self-pay | Admitting: Gynecologic Oncology

## 2022-02-01 ENCOUNTER — Telehealth: Payer: Self-pay | Admitting: *Deleted

## 2022-02-01 NOTE — Telephone Encounter (Signed)
Spoke with pt this morning to inform her that per Dr.Tucker since she has decided to delay and or not have surgery, Dr.Tucker is recommending that she be on some form of treatment for the endometrial cancer. She is recommending having an MRI first to evaluate the uterus to see if there is any evidence of the cancer invading into the muscle wall of the uterus or spread of the cancer in the surrounding tissues.  Based on the results, she may recommend placement of a Mirena IUD int he office to serve as direct treatment to the lining of the uterus. We can set up a phone visit to discuss this further with Dr.Tucker and or we can work on getting the MRI scheduled. A phone appointment has been set up on 5/26 @ 4:30 with Dr. Berline Lopes. Pt verbalized understanding and agreement to the appointment.  ?

## 2022-02-19 ENCOUNTER — Telehealth: Payer: Self-pay | Admitting: Gynecologic Oncology

## 2022-02-19 ENCOUNTER — Inpatient Hospital Stay: Payer: Medicare Other | Attending: Gynecologic Oncology | Admitting: Gynecologic Oncology

## 2022-02-19 NOTE — Telephone Encounter (Signed)
Called patient and schedule time for phone visit.  No answer.  Left voicemail on her cell phone requesting callback.  Also tried her home phone number although got a message that this number is no longer in service.  Jeral Pinch MD Gynecologic Oncology

## 2022-02-23 ENCOUNTER — Telehealth: Payer: Self-pay

## 2022-02-23 NOTE — Telephone Encounter (Signed)
Following up with Alyssa Taylor regarding her missed phone visit on 02/19/22. Patient apologized for missing the call and would like to reschedule.  Phone visit rescheduled for 03/02/22 at 3:45pm. Advised patient that Dr. Berline Lopes is in surgery that day so she may call after appointment time. If she is running behind someone from the office will call and let her know. Patient verbalized understanding and is in agreement of appointment date and time.

## 2022-03-02 ENCOUNTER — Encounter: Payer: Self-pay | Admitting: Gynecologic Oncology

## 2022-03-02 ENCOUNTER — Inpatient Hospital Stay: Payer: Medicare Other | Attending: Gynecologic Oncology | Admitting: Gynecologic Oncology

## 2022-03-02 ENCOUNTER — Telehealth: Payer: Self-pay | Admitting: *Deleted

## 2022-03-02 DIAGNOSIS — C541 Malignant neoplasm of endometrium: Secondary | ICD-10-CM

## 2022-03-02 NOTE — Progress Notes (Signed)
Gynecologic Oncology Telehealth Consult Note: Gyn-Onc  I connected with Alyssa Taylor on 03/02/22 at  3:45 PM EDT by telephone and verified that I am speaking with the correct person using two identifiers.  I discussed the limitations, risks, security and privacy concerns of performing an evaluation and management service by telemedicine and the availability of in-person appointments. I also discussed with the patient that there may be a patient responsible charge related to this service. The patient expressed understanding and agreed to proceed.  Other persons participating in the visit and their role in the encounter: none.  Patient's location: home Provider's location: [facility]  Reason for Visit: follow-up regarding treatment plan  Treatment History: 12/16/21: D&C revealed low grade endometrioid adenocarcinoma  Interval History: Very slight bleeding. One episode of bleeding for 2 days. Denies pain or cramping.   Past Medical/Surgical History: Past Medical History:  Diagnosis Date   Arthritis    right knee   Hernia, hiatal    Hypertension    Stroke [facility])    Vertigo     Past Surgical History:  Procedure Laterality Date   DILATATION & CURETTAGE/HYSTEROSCOPY WITH MYOSURE N/A 12/16/2021   Procedure: DILATATION & CURETTAGE OF UTERUS;  HYSTEROSCOPY;  MYOSURE;  Surgeon: [name], MD;  Location: [place];  Service: Gynecology;  Laterality: N/A;   DILATION AND CURETTAGE OF UTERUS N/A 1975   after a pregnancy   TONSILLECTOMY N/A 1988   TUBAL LIGATION  1984   WISDOM TOOTH EXTRACTION      Family History  Problem Relation Age of Onset   Colon cancer Maternal Grandmother    Breast cancer Neg Hx    Ovarian cancer Neg Hx    Endometrial cancer Neg Hx    Pancreatic cancer Neg Hx    Prostate cancer Neg Hx     Social History   Socioeconomic History   Marital status: Divorced    Spouse name: Not on file   Number of children: Not on file   Years of  education: Not on file   Highest education level: Not on file  Occupational History   Not on file  Tobacco Use   Smoking status: Never   Smokeless tobacco: Never  Vaping Use   Vaping Use: Never used  Substance and Sexual Activity   Alcohol use: Not Currently   Drug use: Never   Sexual activity: Yes  Other Topics Concern   Not on file  Social History Narrative   Not on file   Social Determinants of Health   Financial Resource Strain: Not on file  Food Insecurity: Not on file  Transportation Needs: Not on file  Physical Activity: Not on file  Stress: Not on file  Social Connections: Not on file    Current Medications:  Current Outpatient Medications:    aspirin 81 MG chewable tablet, Chew 81 mg by mouth daily., Disp: , Rfl:    cholecalciferol (VITAMIN D) 25 MCG (1000 UNIT) tablet, Take 1,000 Units by mouth daily., Disp: , Rfl:    lisinopril (ZESTRIL) 5 MG tablet, Take 5 mg by mouth daily., Disp: , Rfl:    senna-docusate (SENOKOT-S) 8.6-50 MG tablet, Take 2 tablets by mouth at bedtime. For AFTER surgery, do not take if having diarrhea, Disp: 30 tablet, Rfl: 0   traMADol (ULTRAM) 50 MG tablet, Take 1 tablet (50 mg total) by mouth every 6 (six) hours as needed for severe pain. For AFTER surgery only, do not take and drive, Disp: 10 tablet, Rfl:  0  Review of Symptoms: Pertinent positives as per HPI.  Physical Exam: LMP  (LMP Unknown)  Deferred given limitations of phone visit.  Laboratory & Radiologic Studies: None new  Assessment & Plan: Alyssa Taylor is a 81 y.o. woman with clinical stage 1 endometrial caner who presents for phone conversation about treatment options.  Patient reports doing well.  We discussed options for treatment. I stressed again that surgery is the standard of care for uterine cancer. We also discussed the possibility of treatment with progesterone and recommended monitoring to assess for response. If we are going to treat with progesterone, I would  recommend MRI to r/o deep MI or extra-uterine disease.   The patient voiced that she would like to move forward with scheduling surgery. We had her scheduled for surgery a couple of months ago and she canceled the evening before; I offered that I am happy to get her rescheduled for surgery but that I would very much appreciate that, if she ultimately decides that she does not want definitive surgery, that she let my office know prior to the day before surgery.  I discussed the assessment and treatment plan with the patient. The patient was provided with an opportunity to ask questions and all were answered. The patient agreed with the plan and demonstrated an understanding of the instructions.   The patient was advised to call back or see an in-person evaluation if the symptoms worsen or if the condition fails to improve as anticipated.   12 minutes of total time was spent for this patient encounter, including preparation, phone counseling with the patient and coordination of care, and documentation of the encounter.   Jeral Pinch, MD  Division of Gynecologic Oncology  Department of Obstetrics and Gynecology  [facility]

## 2022-03-02 NOTE — Telephone Encounter (Signed)
Spoke with pt to stated she preferred the surgery date of March 09, 2022. Informed her they'll also call her and schedule the pre surgical appointment as well. Pt verbalized understanding. [name], NP informed.

## 2022-03-03 ENCOUNTER — Telehealth: Payer: Self-pay | Admitting: *Deleted

## 2022-03-03 NOTE — Patient Instructions (Signed)
DUE TO COVID-19 ONLY TWO VISITORS  (aged 80 and older)  ARE ALLOWED TO COME WITH YOU AND STAY IN THE WAITING ROOM ONLY DURING PRE OP AND PROCEDURE.   **NO VISITORS ARE ALLOWED IN THE SHORT STAY AREA OR RECOVERY ROOM!!**  IF YOU WILL BE ADMITTED INTO THE HOSPITAL YOU ARE ALLOWED ONLY FOUR SUPPORT PEOPLE DURING VISITATION HOURS ONLY (7 AM -8PM)   The support person(s) must pass our screening, gel in and out, and wear a mask at all times, including in the patient's room. Patients must also wear a mask when staff or their support person are in the room. Visitors GUEST BADGE MUST BE WORN VISIBLY  One adult visitor may remain with you overnight and MUST be in the room by 8 P.M.     Your procedure is scheduled on: 03/09/22   Report to [facility] Main Entrance    Report to admitting at: 12:00 PM   Call this number if you have problems the morning of surgery [phone]   Do not eat food :After Midnight.   After Midnight you may have the following liquids until : 11:00 AM DAY OF SURGERY  Water Black Coffee (sugar ok, NO MILK/CREAM OR CREAMERS)  Tea (sugar ok, NO MILK/CREAM OR CREAMERS) regular and decaf                             Plain Jell-O (NO RED)                                           Fruit ices (not with fruit pulp, NO RED)                                     Popsicles (NO RED)                                                                  Juice: apple, WHITE grape, WHITE cranberry Sports drinks like Gatorade (NO RED) Clear broth(vegetable,chicken,beef)               Drink  Ensure drink AT :11:00 AM the day of surgery.    The day of surgery:  Drink ONE (1) Pre-Surgery Clear Ensure or G2 at AM the morning of surgery. Drink in one sitting. Do not sip.  This drink was given to you during your hospital  pre-op appointment visit. Nothing else to drink after completing the  Pre-Surgery Clear Ensure or G2.          If you have questions, please contact your surgeon's  office.  FOLLOW BOWEL PREP AND ANY ADDITIONAL PRE OP INSTRUCTIONS YOU RECEIVED FROM YOUR SURGEON'S OFFICE!!!     Oral Hygiene is also important to reduce your risk of infection.                                    Remember - BRUSH YOUR TEETH THE MORNING OF SURGERY WITH YOUR REGULAR TOOTHPASTE  Do NOT smoke after Midnight   Take these medicines the morning of surgery with A SIP OF WATER: N/A. Use eye drops as usual.  DO NOT TAKE ANY ORAL DIABETIC MEDICATIONS DAY OF YOUR SURGERY  Bring CPAP mask and tubing day of surgery.                              You may not have any metal on your body including hair pins, jewelry, and body piercing             Do not wear make-up, lotions, powders, perfumes/cologne, or deodorant  Do not wear nail polish including gel and S&S, artificial/acrylic nails, or any other type of covering on natural nails including finger and toenails. If you have artificial nails, gel coating, etc. that needs to be removed by a nail salon please have this removed prior to surgery or surgery may need to be canceled/ delayed if the surgeon/ anesthesia feels like they are unable to be safely monitored.   Do not shave  48 hours prior to surgery.   Do not bring valuables to the hospital. [place].   Contacts, dentures or bridgework may not be worn into surgery.   Bring small overnight bag day of surgery.   DO NOT [place]. PHARMACY WILL DISPENSE MEDICATIONS LISTED ON YOUR MEDICATION LIST TO YOU DURING YOUR ADMISSION [place]!    Patients discharged on the day of surgery will not be allowed to drive home.  Someone NEEDS to stay with you for the first 24 hours after anesthesia.   Special Instructions: Bring a copy of your healthcare power of attorney and living will documents         the day of surgery if you haven't scanned them before.              Please read over the following  fact sheets you were given: IF YOU HAVE QUESTIONS ABOUT YOUR PRE-OP INSTRUCTIONS PLEASE CALL [phone]     [facility] - Preparing for Surgery Before surgery, you can play an important role.  Because skin is not sterile, your skin needs to be as free of germs as possible.  You can reduce the number of germs on your skin by washing with CHG (chlorahexidine gluconate) soap before surgery.  CHG is an antiseptic cleaner which kills germs and bonds with the skin to continue killing germs even after washing. Please DO NOT use if you have an allergy to CHG or antibacterial soaps.  If your skin becomes reddened/irritated stop using the CHG and inform your nurse when you arrive at Short Stay. Do not shave (including legs and underarms) for at least 48 hours prior to the first CHG shower.  You may shave your face/neck. Please follow these instructions carefully:  1.  Shower with CHG Soap the night before surgery and the  morning of Surgery.  2.  If you choose to wash your hair, wash your hair first as usual with your  normal  shampoo.  3.  After you shampoo, rinse your hair and body thoroughly to remove the  shampoo.                           4.  Use CHG as you  would any other liquid soap.  You can apply chg directly  to the skin and wash                       Gently with a scrungie or clean washcloth.  5.  Apply the CHG Soap to your body ONLY FROM THE NECK DOWN.   Do not use on face/ open                           Wound or open sores. Avoid contact with eyes, ears mouth and genitals (private parts).                       Wash face,  Genitals (private parts) with your normal soap.             6.  Wash thoroughly, paying special attention to the area where your surgery  will be performed.  7.  Thoroughly rinse your body with warm water from the neck down.  8.  DO NOT shower/wash with your normal soap after using and rinsing off  the CHG Soap.                9.  Pat yourself dry with a clean towel.             10.  Wear clean pajamas.            11.  Place clean sheets on your bed the night of your first shower and do not  sleep with pets. Day of Surgery : Do not apply any lotions/deodorants the morning of surgery.  Please wear clean clothes to the hospital/surgery center.  FAILURE TO FOLLOW THESE INSTRUCTIONS MAY RESULT IN THE CANCELLATION OF YOUR SURGERY PATIENT SIGNATURE_________________________________  NURSE SIGNATURE__________________________________  ________________________________________________________________________

## 2022-03-03 NOTE — Telephone Encounter (Signed)
Attempted to speak with pt regard pre op instructions and medications. Unable to reach her. LVM for return call.

## 2022-03-04 ENCOUNTER — Encounter (HOSPITAL_COMMUNITY)
Admission: RE | Admit: 2022-03-04 | Discharge: 2022-03-04 | Disposition: A | Discharge status: Home / Self Care | Payer: Medicare Other | Source: Ambulatory Visit | Attending: Gynecologic Oncology | Admitting: Gynecologic Oncology

## 2022-03-04 ENCOUNTER — Encounter (HOSPITAL_COMMUNITY): Payer: Self-pay

## 2022-03-04 ENCOUNTER — Telehealth: Payer: Self-pay | Admitting: *Deleted

## 2022-03-04 ENCOUNTER — Other Ambulatory Visit: Payer: Self-pay

## 2022-03-04 DIAGNOSIS — C541 Malignant neoplasm of endometrium: Secondary | ICD-10-CM | POA: Diagnosis not present

## 2022-03-04 DIAGNOSIS — Z01812 Encounter for preprocedural laboratory examination: Secondary | ICD-10-CM | POA: Insufficient documentation

## 2022-03-04 HISTORY — DX: Malignant (primary) neoplasm, unspecified: C80.1

## 2022-03-04 LAB — COMPREHENSIVE METABOLIC PANEL
ALT: 13 U/L (ref 0–44)
AST: 15 U/L (ref 15–41)
Albumin: 3.9 g/dL (ref 3.5–5.0)
Alkaline Phosphatase: 59 U/L (ref 38–126)
Anion gap: 8 (ref 5–15)
BUN: 20 mg/dL (ref 8–23)
CO2: 25 mmol/L (ref 22–32)
Calcium: 9.3 mg/dL (ref 8.9–10.3)
Chloride: 111 mmol/L (ref 98–111)
Creatinine, Ser: 0.82 mg/dL (ref 0.44–1.00)
GFR, Estimated: >60 mL/min (ref >60)
Glucose, Bld: 166 mg/dL — ABNORMAL HIGH (ref 70–99)
Potassium: 3.9 mmol/L (ref 3.5–5.1)
Sodium: 144 mmol/L (ref 135–145)
Total Bilirubin: 0.8 mg/dL (ref 0.3–1.2)
Total Protein: 6.9 g/dL (ref 6.5–8.1)

## 2022-03-04 LAB — CBC
HCT: 42.1 % (ref 36.0–46.0)
Hemoglobin: 13.4 g/dL (ref 12.0–15.0)
MCH: 29.7 pg (ref 26.0–34.0)
MCHC: 31.8 g/dL (ref 30.0–36.0)
MCV: 93.3 fL (ref 80.0–100.0)
Platelets: 297 10*3/uL (ref 150–400)
RBC: 4.51 MIL/uL (ref 3.87–5.11)
RDW: 13.9 % (ref 11.5–15.5)
WBC: 6.4 10*3/uL (ref 4.0–10.5)
nRBC: 0 % (ref 0.0–0.2)

## 2022-03-04 NOTE — Telephone Encounter (Signed)
Spoke with pt today to inform her that we can email her the education materials as well. She verbalized agreement and stated we can email it to [email or web address].

## 2022-03-04 NOTE — Telephone Encounter (Signed)
Spoke with pt today to review pre op education with pt. She reported she picked up the post surgical medication already from when she was going to have surgery before. Informed her she can stopped her aspirin the day before surgery in the AM. Pt stated pre op told her she can stop it the week before. Confirmed with [name], Np that pt can stop it a week before. Educated pt that she needs to stop fish oil (omega 3), red yeast rice, turmeric now before her surgery. Also avoid medications with aspirin in them including headache powders such as BC or Goody's and Excedrin migraine. Eat a light diet the day before surgery.  Examples including soups, broths, toast, yogurt, mashed potatoes.  AVOID GAS PRODUCING FOODS. Things to avoid include carbonated beverages (fizzy beverages, sodas), raw fruits and raw vegetables (uncooked), or beans.  If your bowels are filled with gas, your surgeon will have difficulty visualizing your pelvic organs which increases your surgical risks. STAY ACTIVE WHEN YOU GET HOME. Do mild physical activity. Walking or moving your legs help your circulation and body functions return to normal. No lifting or straining for 6 weeks over 10 pounds. No pushing, pulling, straining for 6 weeks. No sexual activity and nothing in the vagina for 8 weeks. Sent pt the link to sign up for mychart. Informed her once she signs up for mychart we can send her the education on her mychart as well. She verbalized understanding.

## 2022-03-04 NOTE — Progress Notes (Signed)
For Short Stay: [place] appointment date: Date of COVID positive in last 45 days:  Bowel Prep reminder:   For Anesthesia: PCP - [facility]: FNP Cardiologist -   Chest x-ray -  EKG - 12/16/21 Stress Test -  ECHO -  Cardiac Cath -  Pacemaker/ICD device last checked: Pacemaker orders received: Device Rep notified:  Spinal Cord Stimulator:  Sleep Study -  CPAP -   Fasting Blood Sugar -  Checks Blood Sugar _____ times a day Date and result of last Hgb A1c-  Blood Thinner Instructions: Aspirin Instructions: Last Dose:  Activity level: Can go up a flight of stairs and activities of daily living without stopping and without chest pain and/or shortness of breath   Able to exercise without chest pain and/or shortness of breath   Unable to go up a flight of stairs without chest pain and/or shortness of breath     Anesthesia review: Hx: HTN,Stroke.  Patient denies shortness of breath, fever, cough and chest pain at PAT appointment   Patient verbalized understanding of instructions that were given to them at the PAT appointment. Patient was also instructed that they will need to review over the PAT instructions again at home before surgery.

## 2022-03-05 ENCOUNTER — Telehealth: Payer: Self-pay

## 2022-03-05 NOTE — Telephone Encounter (Signed)
Pt has requested we mail her education material to her home address that is in her chart. Pt aware we will send it as soon as possible. She voiced an understanding. Sharyn Lull Wheat aware and will mail  it today.

## 2022-03-08 ENCOUNTER — Telehealth: Payer: Self-pay | Admitting: *Deleted

## 2022-03-08 NOTE — Telephone Encounter (Signed)
Attempted to review pre op education with pt. Unable to reach pt. LVM for return call.

## 2022-03-08 NOTE — Telephone Encounter (Signed)
Attempted to call pt and her sister to review her pre op education. Unable to reach pt or her sister. LVM for return call.

## 2022-03-08 NOTE — Telephone Encounter (Signed)
error 

## 2022-03-09 ENCOUNTER — Ambulatory Visit (HOSPITAL_COMMUNITY)
Admission: RE | Admit: 2022-03-09 | Discharge: 2022-03-10 | Disposition: A | Discharge status: Home / Self Care | Payer: Medicare Other | Source: Ambulatory Visit | Attending: Gynecologic Oncology | Admitting: Gynecologic Oncology

## 2022-03-09 ENCOUNTER — Encounter (HOSPITAL_COMMUNITY)
Admission: RE | Disposition: A | Discharge status: Home / Self Care | Payer: Self-pay | Source: Ambulatory Visit | Attending: Gynecologic Oncology

## 2022-03-09 ENCOUNTER — Other Ambulatory Visit: Payer: Self-pay

## 2022-03-09 ENCOUNTER — Ambulatory Visit (HOSPITAL_COMMUNITY): Payer: Medicare Other | Admitting: Anesthesiology

## 2022-03-09 ENCOUNTER — Ambulatory Visit (HOSPITAL_BASED_OUTPATIENT_CLINIC_OR_DEPARTMENT_OTHER): Payer: Medicare Other | Admitting: Anesthesiology

## 2022-03-09 ENCOUNTER — Encounter (HOSPITAL_COMMUNITY): Payer: Self-pay | Admitting: Gynecologic Oncology

## 2022-03-09 DIAGNOSIS — N736 Female pelvic peritoneal adhesions (postinfective): Secondary | ICD-10-CM | POA: Insufficient documentation

## 2022-03-09 DIAGNOSIS — Z79899 Other long term (current) drug therapy: Secondary | ICD-10-CM | POA: Diagnosis not present

## 2022-03-09 DIAGNOSIS — E669 Obesity, unspecified: Secondary | ICD-10-CM | POA: Insufficient documentation

## 2022-03-09 DIAGNOSIS — Z6837 Body mass index (BMI) 37.0-37.9, adult: Secondary | ICD-10-CM | POA: Diagnosis not present

## 2022-03-09 DIAGNOSIS — K5730 Diverticulosis of large intestine without perforation or abscess without bleeding: Secondary | ICD-10-CM | POA: Diagnosis not present

## 2022-03-09 DIAGNOSIS — Z8673 Personal history of transient ischemic attack (TIA), and cerebral infarction without residual deficits: Secondary | ICD-10-CM | POA: Diagnosis not present

## 2022-03-09 DIAGNOSIS — N8501 Benign endometrial hyperplasia: Secondary | ICD-10-CM

## 2022-03-09 DIAGNOSIS — C541 Malignant neoplasm of endometrium: Secondary | ICD-10-CM

## 2022-03-09 DIAGNOSIS — I739 Peripheral vascular disease, unspecified: Secondary | ICD-10-CM | POA: Diagnosis not present

## 2022-03-09 DIAGNOSIS — I10 Essential (primary) hypertension: Secondary | ICD-10-CM | POA: Insufficient documentation

## 2022-03-09 HISTORY — PX: LYMPH NODE DISSECTION: SHX5087

## 2022-03-09 HISTORY — PX: SENTINEL NODE BIOPSY: SHX6608

## 2022-03-09 HISTORY — PX: ROBOTIC ASSISTED TOTAL HYSTERECTOMY WITH BILATERAL SALPINGO OOPHERECTOMY: SHX6086

## 2022-03-09 LAB — TYPE AND SCREEN
ABO/RH(D): O POS
Antibody Screen: NEGATIVE

## 2022-03-09 SURGERY — HYSTERECTOMY, TOTAL, ROBOT-ASSISTED, LAPAROSCOPIC, WITH BILATERAL SALPINGO-OOPHORECTOMY
Anesthesia: General | Site: Abdomen

## 2022-03-09 MED ORDER — SODIUM CHLORIDE (PF) 0.9 % IJ SOLN
INTRAMUSCULAR | Status: AC
  Filled 2022-03-09: qty 10

## 2022-03-09 MED ORDER — STERILE WATER FOR INJECTION IJ SOLN
INTRAMUSCULAR | Status: AC
  Filled 2022-03-09: qty 10

## 2022-03-09 MED ORDER — TRAMADOL HCL 50 MG PO TABS
100.0000 mg | ORAL_TABLET | Freq: Two times a day (BID) | ORAL | Status: DC | PRN
  Administered 2022-03-10: 100 mg via ORAL
  Filled 2022-03-09: qty 2

## 2022-03-09 MED ORDER — OXYCODONE HCL 5 MG PO TABS
5.0000 mg | ORAL_TABLET | ORAL | Status: DC | PRN
  Administered 2022-03-10: 5 mg via ORAL
  Filled 2022-03-09: qty 1

## 2022-03-09 MED ORDER — ONDANSETRON HCL 4 MG/2ML IJ SOLN: 4.0000 mg | Freq: Once | INTRAMUSCULAR | Status: DC | PRN

## 2022-03-09 MED ORDER — LIDOCAINE 2% (20 MG/ML) 5 ML SYRINGE
INTRAMUSCULAR | Status: DC | PRN
  Administered 2022-03-09: 60 mg via INTRAVENOUS
  Administered 2022-03-09: 1 mg/kg/h via INTRAVENOUS

## 2022-03-09 MED ORDER — CEFAZOLIN SODIUM-DEXTROSE 2-4 GM/100ML-% IV SOLN
2.0000 g | INTRAVENOUS | Status: AC
  Administered 2022-03-09: 2 g via INTRAVENOUS
  Filled 2022-03-09: qty 100

## 2022-03-09 MED ORDER — SENNOSIDES-DOCUSATE SODIUM 8.6-50 MG PO TABS
2.0000 | ORAL_TABLET | Freq: Every day | ORAL | Status: DC
  Filled 2022-03-09: qty 2

## 2022-03-09 MED ORDER — ROCURONIUM BROMIDE 10 MG/ML (PF) SYRINGE
PREFILLED_SYRINGE | INTRAVENOUS | Status: DC | PRN
  Administered 2022-03-09: 10 mg via INTRAVENOUS
  Administered 2022-03-09: 60 mg via INTRAVENOUS
  Administered 2022-03-09: 10 mg via INTRAVENOUS
  Administered 2022-03-09: 20 mg via INTRAVENOUS

## 2022-03-09 MED ORDER — ROCURONIUM BROMIDE 10 MG/ML (PF) SYRINGE
PREFILLED_SYRINGE | INTRAVENOUS | Status: AC
  Filled 2022-03-09: qty 10

## 2022-03-09 MED ORDER — DEXAMETHASONE SODIUM PHOSPHATE 10 MG/ML IJ SOLN
INTRAMUSCULAR | Status: AC
  Filled 2022-03-09: qty 1

## 2022-03-09 MED ORDER — DEXAMETHASONE SODIUM PHOSPHATE 4 MG/ML IJ SOLN: 4.0000 mg | INTRAMUSCULAR | Status: DC

## 2022-03-09 MED ORDER — SUGAMMADEX SODIUM 200 MG/2ML IV SOLN
INTRAVENOUS | Status: DC | PRN
  Administered 2022-03-09: 200 mg via INTRAVENOUS

## 2022-03-09 MED ORDER — STERILE WATER FOR IRRIGATION IR SOLN
Status: DC | PRN
  Administered 2022-03-09: 1000 mL

## 2022-03-09 MED ORDER — STERILE WATER FOR INJECTION IJ SOLN
INTRAMUSCULAR | Status: DC | PRN
  Administered 2022-03-09: 10 mL

## 2022-03-09 MED ORDER — FENTANYL CITRATE (PF) 250 MCG/5ML IJ SOLN
INTRAMUSCULAR | Status: AC
  Filled 2022-03-09: qty 5

## 2022-03-09 MED ORDER — LACTATED RINGERS IR SOLN
Status: DC | PRN
  Administered 2022-03-09: 1000 mL

## 2022-03-09 MED ORDER — ONDANSETRON HCL 4 MG PO TABS: 4.0000 mg | ORAL_TABLET | Freq: Four times a day (QID) | ORAL | Status: DC | PRN

## 2022-03-09 MED ORDER — KETAMINE HCL 10 MG/ML IJ SOLN
INTRAMUSCULAR | Status: DC | PRN
  Administered 2022-03-09: 20 mg via INTRAVENOUS

## 2022-03-09 MED ORDER — HYDROMORPHONE HCL 1 MG/ML IJ SOLN
0.5000 mg | INTRAMUSCULAR | Status: DC | PRN
  Administered 2022-03-09: 0.5 mg via INTRAVENOUS
  Filled 2022-03-09: qty 0.5

## 2022-03-09 MED ORDER — LIDOCAINE HCL (PF) 2 % IJ SOLN
INTRAMUSCULAR | Status: AC
  Filled 2022-03-09: qty 15

## 2022-03-09 MED ORDER — ENSURE PRE-SURGERY PO LIQD
296.0000 mL | Freq: Once | ORAL | Status: DC
  Filled 2022-03-09: qty 296

## 2022-03-09 MED ORDER — IBUPROFEN 400 MG PO TABS: 600.0000 mg | ORAL_TABLET | Freq: Four times a day (QID) | ORAL | Status: DC

## 2022-03-09 MED ORDER — LACTATED RINGERS IV SOLN: INTRAVENOUS | Status: DC

## 2022-03-09 MED ORDER — HEMOSTATIC AGENTS (NO CHARGE) OPTIME
TOPICAL | Status: DC | PRN
  Administered 2022-03-09: 1 via TOPICAL

## 2022-03-09 MED ORDER — ACETAMINOPHEN 500 MG PO TABS
1000.0000 mg | ORAL_TABLET | ORAL | Status: AC
  Administered 2022-03-09: 1000 mg via ORAL
  Filled 2022-03-09: qty 2

## 2022-03-09 MED ORDER — BUPIVACAINE HCL 0.25 % IJ SOLN
INTRAMUSCULAR | Status: DC | PRN
  Administered 2022-03-09: 34 mL

## 2022-03-09 MED ORDER — FENTANYL CITRATE PF 50 MCG/ML IJ SOSY: 25.0000 ug | PREFILLED_SYRINGE | INTRAMUSCULAR | Status: DC | PRN

## 2022-03-09 MED ORDER — ONDANSETRON HCL 4 MG/2ML IJ SOLN: 4.0000 mg | Freq: Four times a day (QID) | INTRAMUSCULAR | Status: DC | PRN

## 2022-03-09 MED ORDER — PHENYLEPHRINE HCL (PRESSORS) 10 MG/ML IV SOLN
INTRAVENOUS | Status: AC
  Filled 2022-03-09: qty 1

## 2022-03-09 MED ORDER — LACTATED RINGERS IV SOLN: INTRAVENOUS | Status: DC | PRN

## 2022-03-09 MED ORDER — PHENYLEPHRINE HCL-NACL 20-0.9 MG/250ML-% IV SOLN
INTRAVENOUS | Status: DC | PRN
  Administered 2022-03-09: 50 ug/min via INTRAVENOUS

## 2022-03-09 MED ORDER — HEPARIN SODIUM (PORCINE) 5000 UNIT/ML IJ SOLN
5000.0000 [IU] | INTRAMUSCULAR | Status: AC
  Administered 2022-03-09: 5000 [IU] via SUBCUTANEOUS
  Filled 2022-03-09: qty 1

## 2022-03-09 MED ORDER — DEXAMETHASONE SODIUM PHOSPHATE 10 MG/ML IJ SOLN
INTRAMUSCULAR | Status: DC | PRN
  Administered 2022-03-09: 10 mg via INTRAVENOUS

## 2022-03-09 MED ORDER — PROPOFOL 10 MG/ML IV BOLUS
INTRAVENOUS | Status: DC | PRN
  Administered 2022-03-09: 120 mg via INTRAVENOUS

## 2022-03-09 MED ORDER — ENOXAPARIN SODIUM 40 MG/0.4ML IJ SOSY: 40.0000 mg | PREFILLED_SYRINGE | INTRAMUSCULAR | Status: DC

## 2022-03-09 MED ORDER — PROPOFOL 10 MG/ML IV BOLUS
INTRAVENOUS | Status: AC
  Filled 2022-03-09: qty 20

## 2022-03-09 MED ORDER — ORAL CARE MOUTH RINSE
15.0000 mL | Freq: Once | OROMUCOSAL | Status: AC
Start: 2022-03-09 — End: 2022-03-09

## 2022-03-09 MED ORDER — ONDANSETRON HCL 4 MG/2ML IJ SOLN
INTRAMUSCULAR | Status: DC | PRN
  Administered 2022-03-09: 4 mg via INTRAVENOUS

## 2022-03-09 MED ORDER — ACETAMINOPHEN 500 MG PO TABS
1000.0000 mg | ORAL_TABLET | Freq: Two times a day (BID) | ORAL | Status: DC
  Administered 2022-03-10: 1000 mg via ORAL
  Filled 2022-03-09: qty 2

## 2022-03-09 MED ORDER — EPHEDRINE SULFATE-NACL 50-0.9 MG/10ML-% IV SOSY
PREFILLED_SYRINGE | INTRAVENOUS | Status: DC | PRN
  Administered 2022-03-09 (×2): 5 mg via INTRAVENOUS

## 2022-03-09 MED ORDER — FENTANYL CITRATE (PF) 250 MCG/5ML IJ SOLN
INTRAMUSCULAR | Status: DC | PRN
  Administered 2022-03-09 (×5): 50 ug via INTRAVENOUS

## 2022-03-09 MED ORDER — KCL IN DEXTROSE-NACL 20-5-0.45 MEQ/L-%-% IV SOLN
INTRAVENOUS | Status: DC
  Filled 2022-03-09: qty 1000

## 2022-03-09 MED ORDER — HYDRALAZINE HCL 20 MG/ML IJ SOLN
INTRAMUSCULAR | Status: AC
  Filled 2022-03-09: qty 1

## 2022-03-09 MED ORDER — ONDANSETRON HCL 4 MG/2ML IJ SOLN
INTRAMUSCULAR | Status: AC
  Filled 2022-03-09: qty 2

## 2022-03-09 MED ORDER — EPHEDRINE 5 MG/ML INJ
INTRAVENOUS | Status: AC
  Filled 2022-03-09: qty 5

## 2022-03-09 MED ORDER — BUPIVACAINE HCL 0.25 % IJ SOLN
INTRAMUSCULAR | Status: AC
  Filled 2022-03-09: qty 1

## 2022-03-09 MED ORDER — CHLORHEXIDINE GLUCONATE 0.12 % MT SOLN
15.0000 mL | Freq: Once | OROMUCOSAL | Status: AC
  Administered 2022-03-09: 15 mL via OROMUCOSAL

## 2022-03-09 MED ORDER — HYDRALAZINE HCL 20 MG/ML IJ SOLN
INTRAMUSCULAR | Status: DC | PRN
  Administered 2022-03-09: 5 mg via INTRAVENOUS
  Administered 2022-03-09 (×2): 2.5 mg via INTRAVENOUS

## 2022-03-09 SURGICAL SUPPLY — 78 items
AGENT HMST KT MTR STRL THRMB (HEMOSTASIS)
APPLICATOR ARISTA FLEXITIP XL (MISCELLANEOUS) ×1 IMPLANT
APPLICATOR SURGIFLO ENDO (HEMOSTASIS) IMPLANT
BACTOSHIELD CHG 4% 4OZ (MISCELLANEOUS) ×1
BAG LAPAROSCOPIC 12 15 PORT 16 (BASKET) IMPLANT
BAG RETRIEVAL 10 (BASKET) ×1
BAG RETRIEVAL 12/15 (BASKET)
BLADE SURG SZ10 CARB STEEL (BLADE) IMPLANT
CATH FOLEY 2WAY SLVR  5CC 16FR (CATHETERS) ×3
CATH FOLEY 2WAY SLVR 5CC 16FR (CATHETERS) IMPLANT
COVER BACK TABLE 60X90IN (DRAPES) ×3 IMPLANT
COVER TIP SHEARS 8 DVNC (MISCELLANEOUS) ×2 IMPLANT
COVER TIP SHEARS 8MM DA VINCI (MISCELLANEOUS) ×3
DERMABOND ADVANCED (GAUZE/BANDAGES/DRESSINGS) ×1
DERMABOND ADVANCED .7 DNX12 (GAUZE/BANDAGES/DRESSINGS) ×2 IMPLANT
DRAPE ARM DVNC X/XI (DISPOSABLE) ×8 IMPLANT
DRAPE COLUMN DVNC XI (DISPOSABLE) ×2 IMPLANT
DRAPE DA VINCI XI ARM (DISPOSABLE) ×12
DRAPE DA VINCI XI COLUMN (DISPOSABLE) ×3
DRAPE SHEET LG 3/4 BI-LAMINATE (DRAPES) ×3 IMPLANT
DRAPE SURG IRRIG POUCH 19X23 (DRAPES) ×3 IMPLANT
DRSG OPSITE POSTOP 4X6 (GAUZE/BANDAGES/DRESSINGS) IMPLANT
DRSG OPSITE POSTOP 4X8 (GAUZE/BANDAGES/DRESSINGS) IMPLANT
ELECT PENCIL ROCKER SW 15FT (MISCELLANEOUS) IMPLANT
ELECT REM PT RETURN 15FT ADLT (MISCELLANEOUS) ×3 IMPLANT
GAUZE 4X4 16PLY ~~LOC~~+RFID DBL (SPONGE) ×3 IMPLANT
GLOVE BIO SURGEON STRL SZ 6 (GLOVE) ×12 IMPLANT
GLOVE BIO SURGEON STRL SZ 6.5 (GLOVE) ×6 IMPLANT
GOWN STRL REUS W/ TWL LRG LVL3 (GOWN DISPOSABLE) ×8 IMPLANT
GOWN STRL REUS W/TWL LRG LVL3 (GOWN DISPOSABLE) ×12
HEMOSTAT ARISTA ABSORB 3G PWDR (HEMOSTASIS) ×1 IMPLANT
HOLDER FOLEY CATH W/STRAP (MISCELLANEOUS) IMPLANT
IRRIG SUCT STRYKERFLOW 2 WTIP (MISCELLANEOUS) ×3
IRRIGATION SUCT STRKRFLW 2 WTP (MISCELLANEOUS) ×2 IMPLANT
KIT PROCEDURE DA VINCI SI (MISCELLANEOUS) ×3
KIT PROCEDURE DVNC SI (MISCELLANEOUS) IMPLANT
KIT TURNOVER KIT A (KITS) IMPLANT
LIGASURE IMPACT 36 18CM CVD LR (INSTRUMENTS) IMPLANT
MANIPULATOR ADVINCU DEL 3.0 PL (MISCELLANEOUS) IMPLANT
MANIPULATOR ADVINCU DEL 3.5 PL (MISCELLANEOUS) IMPLANT
MANIPULATOR UTERINE 4.5 ZUMI (MISCELLANEOUS) ×1 IMPLANT
NDL HYPO 21X1.5 SAFETY (NEEDLE) ×2 IMPLANT
NDL SPNL 18GX3.5 QUINCKE PK (NEEDLE) IMPLANT
NEEDLE HYPO 21X1.5 SAFETY (NEEDLE) ×3 IMPLANT
NEEDLE SPNL 18GX3.5 QUINCKE PK (NEEDLE) ×3 IMPLANT
OBTURATOR OPTICAL STANDARD 8MM (TROCAR) ×3
OBTURATOR OPTICAL STND 8 DVNC (TROCAR) ×2
OBTURATOR OPTICALSTD 8 DVNC (TROCAR) ×2 IMPLANT
PACK ROBOT GYN CUSTOM WL (TRAY / TRAY PROCEDURE) ×3 IMPLANT
PAD POSITIONING PINK XL (MISCELLANEOUS) ×3 IMPLANT
PORT ACCESS TROCAR AIRSEAL 12 (TROCAR) ×2 IMPLANT
PORT ACCESS TROCAR AIRSEAL 5M (TROCAR) ×1
SCRUB CHG 4% DYNA-HEX 4OZ (MISCELLANEOUS) ×2 IMPLANT
SEAL CANN UNIV 5-8 DVNC XI (MISCELLANEOUS) ×8 IMPLANT
SEAL XI 5MM-8MM UNIVERSAL (MISCELLANEOUS) ×12
SET TRI-LUMEN FLTR TB AIRSEAL (TUBING) ×3 IMPLANT
SPIKE FLUID TRANSFER (MISCELLANEOUS) ×3 IMPLANT
SPONGE T-LAP 18X18 ~~LOC~~+RFID (SPONGE) IMPLANT
SURGIFLO W/THROMBIN 8M KIT (HEMOSTASIS) IMPLANT
SUT MNCRL AB 4-0 PS2 18 (SUTURE) IMPLANT
SUT PDS AB 1 TP1 96 (SUTURE) IMPLANT
SUT VIC AB 0 CT1 27 (SUTURE) ×3
SUT VIC AB 0 CT1 27XBRD ANTBC (SUTURE) IMPLANT
SUT VIC AB 2-0 CT1 27 (SUTURE)
SUT VIC AB 2-0 CT1 TAPERPNT 27 (SUTURE) IMPLANT
SUT VIC AB 4-0 PS2 18 (SUTURE) ×6 IMPLANT
SYR 10ML LL (SYRINGE) ×1 IMPLANT
SYS BAG RETRIEVAL 10MM (BASKET) ×2
SYS WOUND ALEXIS 18CM MED (MISCELLANEOUS)
SYSTEM BAG RETRIEVAL 10MM (BASKET) IMPLANT
SYSTEM WOUND ALEXIS 18CM MED (MISCELLANEOUS) IMPLANT
TOWEL OR NON WOVEN STRL DISP B (DISPOSABLE) IMPLANT
TRAP SPECIMEN MUCUS 40CC (MISCELLANEOUS) IMPLANT
TRAY FOLEY MTR SLVR 16FR STAT (SET/KITS/TRAYS/PACK) ×3 IMPLANT
TROCAR Z-THREAD FIOS 5X100MM (TROCAR) IMPLANT
UNDERPAD 30X36 HEAVY ABSORB (UNDERPADS AND DIAPERS) ×6 IMPLANT
WATER STERILE IRR 1000ML POUR (IV SOLUTION) ×3 IMPLANT
YANKAUER SUCT BULB TIP 10FT TU (MISCELLANEOUS) IMPLANT

## 2022-03-09 NOTE — Op Note (Signed)
OPERATIVE NOTE  Pre-operative Diagnosis: endometrial cancer grade 1  Post-operative Diagnosis: same, suspected history of diverticulitis  Operation: Robotic-assisted laparoscopic total hysterectomy with bilateral salpingo-oophorectomy, SLN biopsy bilaterally and excision of additional prominent lymph node, lysis of adhesions for 45 minutes, cystoscopy  Surgeon: Jeral Pinch MD  Assistant Surgeon: Lahoma Crocker MD (an MD assistant was necessary for tissue manipulation, management of robotic instrumentation, retraction and positioning due to the complexity of the case and hospital policies).   Anesthesia: GET  Urine Output: 200cc  Operative Findings: On EUA, mobile 10 cm uterus.  On intra-abdominal entry, normal upper abdominal survey including diaphragm, liver edge, and stomach.  Normal-appearing small bowel and omentum.  Sigmoid colon with significant adhesions to the left pelvic sidewall and posterior uterus/cervix, suspected to be related to prior incidence of diverticulitis given diverticular disease noted.  Left ovary somewhat enlarged for postmenopausal status and again adherent to the colon.  Right adnexa atrophic and mildly abnormal fallopian tube.  Uterus approximately 10 cm and somewhat bulbous at the fundus.  Mapping successful to bilateral pelvic basins, right obturator sentinel lymph node and left external iliac sentinel lymph node.  Additional green lymph node noted at the bifurcation of the external and internal iliac arteries (resected as mildly prominent).  On cystoscopy, bladder dome intact, good efflux noted from bilateral ureteral orifices.  Estimated Blood Loss:  150cc      Total IV Fluids: see I&O flowsheet         Specimens: uterus, cervix, bilateral tubes and ovaries, right obturator SLN, right external iliac enlarged (green) lymph node, left external iliac SLN         Complications:  None apparent; patient tolerated the procedure well.         Disposition:  PACU - hemodynamically stable.  Procedure Details  The patient was seen in the Holding Room. The risks, benefits, complications, treatment options, and expected outcomes were discussed with the patient.  The patient concurred with the proposed plan, giving informed consent.  The site of surgery properly noted/marked. The patient was identified as Alyssa Taylor and the procedure verified as a Robotic-assisted hysterectomy with bilateral salpingo oophorectomy with SLN biopsy.   After induction of anesthesia, the patient was draped and prepped in the usual sterile manner. Patient was placed in supine position after anesthesia and draped and prepped in the usual sterile manner as follows: Her arms were tucked to her side with all appropriate precautions.  The shoulders were stabilized with padded shoulder blocks applied to the acromium processes.  The patient was placed in the semi-lithotomy position in [place].  The perineum and vagina were prepped with CholoraPrep. The patient was draped after the CholoraPrep had been allowed to dry for 3 minutes.  A Time Out was held and the above information confirmed.  The urethra was prepped with Betadine. Foley catheter was placed.  A sterile speculum was placed in the vagina.  The cervix was grasped with a single-tooth tenaculum. '2mg'$  total of ICG was injected into the cervical stroma at 2 and 9 o'clock with 1cc injected at a 1cm and 24m depth (concentration 0.'5mg'$ /ml) in all locations. The cervix was dilated with PKennon Roundsdilators.  The ZUMI uterine manipulator with a medium colpotomizer ring was placed without difficulty.  A pneum occluder balloon was placed over the manipulator.  OG tube placement was confirmed and to suction.   Next, a 10 mm skin incision was made 1 cm below the subcostal margin in the midclavicular line.  The 5 mm Optiview port and scope was used for direct entry.  Opening pressure was under 10 mm CO2.  The abdomen was insufflated and the findings  were noted as above.   At this point and all points during the procedure, the patient's intra-abdominal pressure did not exceed 15 mmHg. Next, an 8 mm skin incision was made superior to the umbilicus and a right and left port were placed about 8 cm lateral to the robot port on the right and left side.  A fourth arm was placed on the right.  The 5 mm assist trocar was exchanged for a 10-12 mm port. All ports were placed under direct visualization.  The patient was placed in steep Trendelenburg.  Bowel was folded away into the upper abdomen.  The robot was docked in the normal manner.  The right and left peritoneum were opened parallel to the IP ligament to open the retroperitoneal spaces bilaterally. The round ligaments were transected. The SLN mapping was performed in bilateral pelvic basins. After identifying the ureters, the para rectal and paravesical spaces were opened up entirely with careful dissection below the level of the ureters bilaterally and to the depth of the uterine artery origin in order to skeletonize the uterine "web" and ensure visualization of all parametrial channels. The para-aortic basins were carefully exposed and evaluated for isolated para-aortic SLN's. Lymphatic channels were identified travelling to the following visualized sentinel lymph node's: right obturator and left external iliac SLNs. These SLN's were separated from their surrounding lymphatic tissue, removed and sent for permanent pathology.  Additional green lymph node at the bifurcation of the common iliac into the external and internal iliac on the right was noted to be mildly prominent and concerning for possible metastasis.  This lymph node was also excised.  Attention was then turned to the colon.  A combination of sharp and blunt dissection was used to slowly and meticulously free the colon and its mesentery from the left adnexa and to mobilize the sigmoid from the posterior cervix and lower uterine segment.  The  hysterectomy was started.  The ureter was again noted to be on the medial leaf of the broad ligament.  The peritoneum above the ureter was incised and stretched and the infundibulopelvic ligament was skeletonized, cauterized and cut.  On the left, once the colon had been freed below the adnexa itself, the infundibulopelvic ligament was skeletonized, cauterized, and cut.  The posterior peritoneum was taken down to the level of the KOH ring on the right.  The anterior peritoneum was also taken down.  The bladder flap was created to the level of the KOH ring.  The uterine artery on the right side was skeletonized, cauterized and cut in the normal manner.    On the left, once the colon had been mobilized enough to visualize the utero-ovarian ligament and proximal fallopian tube, these were both skeletonized, cauterized, and cut.  The left adnexa was placed in a bag to be handed out through the vagina after delivery of the uterus.  Given dense adhesions and what I feared to represent prior diverticular event, I was concerned that additional attempt at colon mobilization might result in a colotomy and require an ostomy given no bowel prep.  Because of this, the peritoneum above the sigmoid colon along the pelvic sidewall was incised and used to mobilize the adherent colon so that ultimately the left uterine vessels could be taken and dropped laterally below the ring.  The colpotomy was made and  the uterus, cervix, bilateral ovaries and tubes were amputated and delivered through the vagina.  Pedicles were inspected and excellent hemostasis was achieved.    The colpotomy at the vaginal cuff was closed with two Vicryl on a CT1 needle in running manner, starting at each apex, and overlapping in the midline.  Irrigation was used and good hemostasis was achieved.  Given degree of blunt dissection and adhesions, decision made to place [place] within the surgical bed.   At this time, surgeons attention was turned to  cystoscopy which was performed with findings as noted above.  This was done after approximately 250 cc of sterile fluid was instilled into the bladder.  After cystoscopy, Foley catheter was replaced.  Surgeons gloves were then changed.  At this point in the procedure was completed.  Robotic instruments were removed under direct visulaization.  The robot was undocked. The fascia at the 10-12 mm port was closed with 0 Vicryl on a UR-5 needle.  The subcuticular tissue was closed with 4-0 Vicryl and the skin was closed with 4-0 Monocryl in a subcuticular manner the exception of the right lateral incision which required mattress sutures on the skin.  Dermabond was applied.    The vagina was swabbed with minimal bleeding noted.   All sponge, lap and needle counts were correct x  3.   The patient was transferred to the recovery room in stable condition.  Jeral Pinch, MD

## 2022-03-09 NOTE — Brief Op Note (Signed)
03/09/2022  5:34 PM  PATIENT:  Alyssa Taylor  81 y.o. female  PRE-OPERATIVE DIAGNOSIS:  ENDOMETRIAL CANCER  POST-OPERATIVE DIAGNOSIS:  ENDOMETRIAL CANCER  PROCEDURE:  Procedure(s): XI ROBOTIC ASSISTED TOTAL HYSTERECTOMY WITH BILATERAL SALPINGO OOPHORECTOMY, CYSTOSCOPY (Bilateral) SENTINEL NODE BIOPSY (N/A) LYMPH NODE DISSECTION (N/A)  SURGEON:  Surgeon(s) and Role:    [name], MD - Primary    Lahoma Crocker, MD - Assisting   ANESTHESIA:   general  EBL:  150 mL   BLOOD ADMINISTERED:none  DRAINS: none   LOCAL MEDICATIONS USED:  MARCAINE     SPECIMEN:  uterus, cervix, bilateral adnexal, bilateral SLNs (right obturator and left external iliac), right enlarged external iliac LN  DISPOSITION OF SPECIMEN:  PATHOLOGY  COUNTS:  YES  TOURNIQUET:  * No tourniquets in log *  DICTATION: .Note written in EPIC  PLAN OF CARE: Admit for overnight observation  PATIENT DISPOSITION:  PACU - hemodynamically stable.   Delay start of Pharmacological VTE agent (>24hrs) due to surgical blood loss or risk of bleeding: no

## 2022-03-09 NOTE — Transfer of Care (Signed)
Immediate Anesthesia Transfer of Care Note  Patient: Catheline Hixon  Procedure(s) Performed: XI ROBOTIC ASSISTED TOTAL HYSTERECTOMY WITH BILATERAL SALPINGO OOPHORECTOMY, CYSTOSCOPY (Bilateral: Abdomen) SENTINEL NODE BIOPSY (Abdomen) LYMPH NODE DISSECTION (Abdomen)  Patient Location: PACU  Anesthesia Type:General  Level of Consciousness: awake, alert  and oriented  Airway & Oxygen Therapy: Patient Spontanous Breathing and Patient connected to face mask oxygen  Post-op Assessment: Report given to RN, Post -op Vital signs reviewed and stable and Patient moving all extremities X 4  Post vital signs: Reviewed and stable  Last Vitals:  Vitals Value Taken Time  BP 144/66   Temp    Pulse 84 03/09/22 1727  Resp 18 03/09/22 1727  SpO2 92 % 03/09/22 1727  Vitals shown include unvalidated device data.  Last Pain:  Vitals:   03/09/22 1226  TempSrc:   PainSc: 0-No pain         Complications: No notable events documented.

## 2022-03-09 NOTE — Anesthesia Preprocedure Evaluation (Signed)
Anesthesia Evaluation  Patient identified by MRN, date of birth, ID band Patient awake    Reviewed: Allergy & Precautions, NPO status , Patient's Chart, lab work & pertinent test results  Airway Mallampati: III  TM Distance: >3 FB Neck ROM: Full    Dental  (+) Teeth Intact, Dental Advisory Given   Pulmonary neg pulmonary ROS,    Pulmonary exam normal breath sounds clear to auscultation       Cardiovascular hypertension, Pt. on medications + Peripheral Vascular Disease  Normal cardiovascular exam Rhythm:Regular Rate:Normal     Neuro/Psych CVA    GI/Hepatic Neg liver ROS, hiatal hernia, GERD  ,  Endo/Other  Obesity   Renal/GU negative Renal ROS     Musculoskeletal  (+) Arthritis ,   Abdominal   Peds  Hematology negative hematology ROS (+)   Anesthesia Other Findings Day of surgery medications reviewed with the patient.  Reproductive/Obstetrics Endometrial cancer                              Anesthesia Physical Anesthesia Plan  ASA: 3  Anesthesia Plan: General   Post-op Pain Management: Tylenol PO (pre-op)*   Induction: Intravenous  PONV Risk Score and Plan: 4 or greater and Dexamethasone, Ondansetron and Treatment may vary due to age or medical condition  Airway Management Planned: Oral ETT  Additional Equipment:   Intra-op Plan:   Post-operative Plan: Extubation in OR  Informed Consent: I have reviewed the patients History and Physical, chart, labs and discussed the procedure including the risks, benefits and alternatives for the proposed anesthesia with the patient or authorized representative who has indicated his/her understanding and acceptance.     Dental advisory given  Plan Discussed with: CRNA  Anesthesia Plan Comments:         Anesthesia Quick Evaluation

## 2022-03-09 NOTE — Anesthesia Postprocedure Evaluation (Signed)
Anesthesia Post Note  Patient: Alyssa Taylor  Procedure(s) Performed: XI ROBOTIC ASSISTED TOTAL HYSTERECTOMY WITH BILATERAL SALPINGO OOPHORECTOMY, CYSTOSCOPY (Bilateral: Abdomen) SENTINEL NODE BIOPSY (Abdomen) LYMPH NODE DISSECTION (Abdomen)     Patient location during evaluation: PACU Anesthesia Type: General Level of consciousness: awake and alert Pain management: pain level controlled Vital Signs Assessment: post-procedure vital signs reviewed and stable Respiratory status: spontaneous breathing, nonlabored ventilation, respiratory function stable and patient connected to nasal cannula oxygen Cardiovascular status: blood pressure returned to baseline and stable Postop Assessment: no apparent nausea or vomiting Anesthetic complications: no   No notable events documented.  Last Vitals:  Vitals:   03/09/22 2002 03/09/22 2156  BP: (!) 151/73 (!) 154/69  Pulse: 65 79  Resp: 16 16  Temp:  (!) 36.4 C  SpO2: 100% 94%    Last Pain:  Vitals:   03/09/22 2156  TempSrc: Oral  PainSc:                  [name]

## 2022-03-09 NOTE — Discharge Instructions (Signed)
AFTER SURGERY INSTRUCTIONS   Return to work: 4-6 weeks if applicable  Do not resume your aspirin and supplements for one week after surgery.  Please call the office for any new symptoms or if you are feeling poorly.   Activity: 1. Be up and out of the bed during the day.  Take a nap if needed.  You may walk up steps but be careful and use the hand rail.  Stair climbing will tire you more than you think, you may need to stop part way and rest.    2. No lifting or straining for 6 weeks over 10 pounds. No pushing, pulling, straining for 6 weeks.   3. No driving for around 1 week(s).  Do not drive if you are taking narcotic pain medicine and make sure that your reaction time has returned.    4. You can shower as soon as the next day after surgery. Shower daily.  Use your regular soap and water (not directly on the incision) and pat your incision(s) dry afterwards; don't rub.  No tub baths or submerging your body in water until cleared by your surgeon. If you have the soap that was given to you by pre-surgical testing that was used before surgery, you do not need to use it afterwards because this can irritate your incisions.    5. No sexual activity and nothing in the vagina for 8 weeks.   6. You may experience a small amount of clear drainage from your incisions, which is normal.  If the drainage persists, increases, or changes color please call the office.   7. Do not use creams, lotions, or ointments such as neosporin on your incisions after surgery until advised by your surgeon because they can cause removal of the dermabond glue on your incisions.     8. You may experience vaginal spotting after surgery or around the 6-8 week mark from surgery when the stitches at the top of the vagina begin to dissolve.  The spotting is normal but if you experience heavy bleeding, call our office.   9. Take Tylenol or ibuprofen first for pain and only use narcotic pain medication for severe pain not  relieved by the Tylenol or Ibuprofen.  Monitor your Tylenol intake to a max of 4,000 mg in a 24 hour period. You can alternate these medications after surgery.   Diet: 1. Low sodium Heart Healthy Diet is recommended but you are cleared to resume your normal (before surgery) diet after your procedure.   2. It is safe to use a laxative, such as Miralax or Colace, if you have difficulty moving your bowels. You have been prescribed Sennakot-S to take at bedtime every evening after surgery to keep bowel movements regular and to prevent constipation.     Wound Care: 1. Keep clean and dry.  Shower daily.   Reasons to call the Doctor: Fever - Oral temperature greater than 100.4 degrees Fahrenheit Foul-smelling vaginal discharge Difficulty urinating Nausea and vomiting Increased pain at the site of the incision that is unrelieved with pain medicine. Difficulty breathing with or without chest pain New calf pain especially if only on one side Sudden, continuing increased vaginal bleeding with or without clots.   Contacts: For questions or concerns you should contact:   Dr. Eugene Garnet at [phone]   Warner Mccreedy, NP at [phone]   After Hours: call [phone] and have the GYN Oncologist paged/contacted (after 5 pm or on the weekends).   Messages sent via mychart are  for non-urgent matters and are not responded to after hours so for urgent needs, please call the after hours number.

## 2022-03-09 NOTE — H&P (Addendum)
Gynecologic Oncology Return Clinic Visit  03/09/22  Treatment History: 12/16/21: D&C revealed low grade endometrioid adenocarcinoma  Interval History: Very slight bleeding. One episode of bleeding for 2 days. Denies pain or cramping.   Past Medical/Surgical History: Past Medical History:  Diagnosis Date   Arthritis    right knee   Cancer ([place])    Hernia, hiatal    Hypertension    Stroke [facility])    Vertigo     Past Surgical History:  Procedure Laterality Date   DILATATION & CURETTAGE/HYSTEROSCOPY WITH MYOSURE N/A 12/16/2021   Procedure: DILATATION & CURETTAGE OF UTERUS;  HYSTEROSCOPY;  MYOSURE;  Surgeon: [name], MD;  Location: [place];  Service: Gynecology;  Laterality: N/A;   DILATION AND CURETTAGE OF UTERUS N/A 1975   after a pregnancy   TONSILLECTOMY N/A 1988   TUBAL LIGATION  1984   WISDOM TOOTH EXTRACTION      Family History  Problem Relation Age of Onset   Colon cancer Maternal Grandmother    Breast cancer Neg Hx    Ovarian cancer Neg Hx    Endometrial cancer Neg Hx    Pancreatic cancer Neg Hx    Prostate cancer Neg Hx     Social History   Socioeconomic History   Marital status: Divorced    Spouse name: Not on file   Number of children: Not on file   Years of education: Not on file   Highest education level: Not on file  Occupational History   Not on file  Tobacco Use   Smoking status: Never   Smokeless tobacco: Never  Vaping Use   Vaping Use: Never used  Substance and Sexual Activity   Alcohol use: Not Currently   Drug use: Never   Sexual activity: Yes  Other Topics Concern   Not on file  Social History Narrative   Not on file   Social Determinants of Health   Financial Resource Strain: Not on file  Food Insecurity: Not on file  Transportation Needs: Not on file  Physical Activity: Not on file  Stress: Not on file  Social Connections: Not on file    Current Medications:  Current Facility-Administered  Medications:    ceFAZolin (ANCEF) IVPB 2g/100 mL premix, 2 g, Intravenous, On Call to OR, Cross, Melissa D, NP   dexamethasone (DECADRON) injection 4 mg, 4 mg, Intravenous, On Call to OR, Cross, Melissa D, NP   feeding supplement (ENSURE PRE-SURGERY) liquid 296 mL, 296 mL, Oral, Once, Cross, Melissa D, NP   lactated ringers infusion, , Intravenous, Continuous, Suzette Battiest, MD, Last Rate: 10 mL/hr at 03/09/22 1231, New Bag at 03/09/22 1231  Review of Systems: Denies appetite changes, fevers, chills, fatigue, unexplained weight changes. Denies hearing loss, neck lumps or masses, mouth sores, ringing in ears or voice changes. Denies cough or wheezing.  Denies shortness of breath. Denies chest pain or palpitations. Denies leg swelling. Denies abdominal distention, pain, blood in stools, constipation, diarrhea, nausea, vomiting, or early satiety. Denies pain with intercourse, dysuria, frequency, hematuria or incontinence. Denies hot flashes.   Denies joint pain, back pain or muscle pain/cramps. Denies itching, rash, or wounds. Denies dizziness, headaches, numbness or seizures. Denies swollen lymph nodes or glands, denies easy bruising or bleeding. Denies anxiety, depression, confusion, or decreased concentration.  Physical Exam: BP (!) 172/110   Pulse 64   Temp 97.7 F (36.5 C) (Oral)   Resp 18   Ht '5\' 2"'$  (1.575 m)   Wt 205 lb (  93 kg)   LMP  (LMP Unknown)   SpO2 99%   BMI 37.49 kg/m  General: Alert, oriented, no acute distress.  HEENT: Normocephalic, atraumatic. Sclera anicteric.  Chest: Clear to auscultation bilaterally. No wheezes, rhonchi, or rales. Cardiovascular: Regular rate and rhythm, no murmurs, rubs, or gallops.  Abdomen: Obese. Normoactive bowel sounds. Soft, nondistended, nontender to palpation. No masses or hepatosplenomegaly appreciated. No palpable fluid wave.  Extremities: Grossly normal range of motion. Warm, well perfused. No edema bilaterally.  Skin: No  rashes or lesions.   Laboratory & Radiologic Studies: CBC    Component Value Date/Time   WBC 6.4 03/04/2022 1137   RBC 4.51 03/04/2022 1137   HGB 13.4 03/04/2022 1137   HCT 42.1 03/04/2022 1137   PLT 297 03/04/2022 1137   MCV 93.3 03/04/2022 1137   MCH 29.7 03/04/2022 1137   MCHC 31.8 03/04/2022 1137   RDW 13.9 03/04/2022 1137       Latest Ref Rng & Units 03/04/2022   11:37 AM 12/30/2021   12:07 PM 12/16/2021    2:01 PM  BMP  Glucose 70 - 99 mg/dL 166  114  105   BUN 8 - 23 mg/dL '20  20  17   '$ Creatinine 0.44 - 1.00 mg/dL 0.82  0.82  0.80   Sodium 135 - 145 mmol/L 144  141  141   Potassium 3.5 - 5.1 mmol/L 3.9  4.2  3.7   Chloride 98 - 111 mmol/L 111  111  104   CO2 22 - 32 mmol/L 25  26    Calcium 8.9 - 10.3 mg/dL 9.3  8.8     Assessment & Plan: Alyssa Taylor is a 81 y.o. 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  Plan for staging surgery robotically today.   Plan for a robotic assisted hysterectomy, bilateral salpingo-oophorectomy, sentinel lymph node evaluation, possible lymph node dissection, possible laparotomy. The risks of surgery were discussed in detail and she understands these to include infection; wound separation; hernia; vaginal cuff separation, injury to adjacent organs such as bowel, bladder, blood vessels, ureters and nerves; bleeding which may require blood transfusion; anesthesia risk; thromboembolic events; possible death; unforeseen complications; possible need for re-exploration; medical complications such as heart attack, stroke, pleural effusion and pneumonia; and, if full lymphadenectomy is performed the risk of lymphedema and lymphocyst. The patient will receive DVT and antibiotic prophylaxis as indicated. She voiced a clear understanding. She had the opportunity to ask questions.   Jeral Pinch, MD  Division of Gynecologic Oncology  Department of Obstetrics and Gynecology  [facility]

## 2022-03-09 NOTE — Anesthesia Procedure Notes (Signed)
Procedure Name: Intubation Date/Time: 03/09/2022 2:20 PM  Performed by: Sharlette Dense, CRNAPre-anesthesia Checklist: Patient identified, Emergency Drugs available, Suction available and Patient being monitored Patient Re-evaluated:Patient Re-evaluated prior to induction Oxygen Delivery Method: Circle system utilized Preoxygenation: Pre-oxygenation with 100% oxygen Induction Type: IV induction Ventilation: Mask ventilation without difficulty Laryngoscope Size: Miller and 2 Grade View: Grade I Tube type: Oral Tube size: 7.5 mm Number of attempts: 1 Airway Equipment and Method: Stylet Placement Confirmation: ETT inserted through vocal cords under direct vision, positive ETCO2 and breath sounds checked- equal and bilateral Secured at: 22 cm Tube secured with: Tape Dental Injury: Teeth and Oropharynx as per pre-operative assessment

## 2022-03-10 ENCOUNTER — Encounter (HOSPITAL_COMMUNITY): Payer: Self-pay | Admitting: Gynecologic Oncology

## 2022-03-10 DIAGNOSIS — C541 Malignant neoplasm of endometrium: Secondary | ICD-10-CM | POA: Diagnosis not present

## 2022-03-10 LAB — CBC
HCT: 40.2 % (ref 36.0–46.0)
Hemoglobin: 13 g/dL (ref 12.0–15.0)
MCH: 29.9 pg (ref 26.0–34.0)
MCHC: 32.3 g/dL (ref 30.0–36.0)
MCV: 92.4 fL (ref 80.0–100.0)
Platelets: 295 10*3/uL (ref 150–400)
RBC: 4.35 MIL/uL (ref 3.87–5.11)
RDW: 13.8 % (ref 11.5–15.5)
WBC: 8.2 10*3/uL (ref 4.0–10.5)
nRBC: 0 % (ref 0.0–0.2)

## 2022-03-10 LAB — BASIC METABOLIC PANEL
Anion gap: 6 (ref 5–15)
BUN: 11 mg/dL (ref 8–23)
CO2: 24 mmol/L (ref 22–32)
Calcium: 8.9 mg/dL (ref 8.9–10.3)
Chloride: 107 mmol/L (ref 98–111)
Creatinine, Ser: 0.61 mg/dL (ref 0.44–1.00)
GFR, Estimated: >60 mL/min (ref >60)
Glucose, Bld: 201 mg/dL — ABNORMAL HIGH (ref 70–99)
Potassium: 4.3 mmol/L (ref 3.5–5.1)
Sodium: 137 mmol/L (ref 135–145)

## 2022-03-10 MED ORDER — LABETALOL HCL 5 MG/ML IV SOLN
10.0000 mg | Freq: Once | INTRAVENOUS | Status: AC
  Administered 2022-03-10: 10 mg via INTRAVENOUS
  Filled 2022-03-10: qty 4

## 2022-03-10 NOTE — Progress Notes (Signed)
Discharge instructions given to patient and all questions were answered.  

## 2022-03-10 NOTE — Discharge Summary (Signed)
Physician Discharge Summary  Patient ID: Alyssa Taylor MRN: [id number] DOB/AGE: 10-21-1940 81 y.o.  Admit date: 03/09/2022 Discharge date: 03/10/2022  Admission Diagnoses: Endometrial adenocarcinoma [facility])  Discharge Diagnoses:  Principal Problem:   Endometrial adenocarcinoma [facility]) Active Problems:   Endometrial cancer [facility])   Discharged Condition:  The patient is in good condition and stable for discharge.    Hospital Course: On 03/09/2022, the patient underwent the following: Procedure(s): XI ROBOTIC ASSISTED TOTAL HYSTERECTOMY WITH BILATERAL SALPINGO OOPHORECTOMY, CYSTOSCOPY, SENTINEL NODE BIOPSY LYMPH NODE DISSECTION. Due to the complexity (extensive lysis of adhesions) and length of the surgery, the patient was monitored overnight. The postoperative course was uneventful.  She was discharged to home on postoperative day 1 tolerating a regular diet, ambulating, voiding, pain controlled.   Consults: None  Significant Diagnostic Studies: Am labs  Treatments: surgery: see above  Discharge Exam: Blood pressure (!) 151/70, pulse 65, temperature 98.1 F (36.7 C), temperature source Oral, resp. rate 18, height '5\' 2"'$  (1.575 m), weight 205 lb (93 kg), SpO2 98 %. General appearance: alert, cooperative, and no distress Resp: clear to auscultation bilaterally Cardio: regular rate and rhythm, S1, S2 normal, no murmur, click, rub or gallop GI: soft, non-tender; bowel sounds normal; no masses,  no organomegaly Extremities: extremities normal, atraumatic, no cyanosis or edema Incision/Wound: lap sites to the abdomen with dermabond intact without drainage  Disposition: Discharge disposition: 01-Home or Self Care       Discharge Instructions     Call MD for:  difficulty breathing, headache or visual disturbances   Complete by: As directed    Call MD for:  extreme fatigue   Complete by: As directed    Call MD for:  hives   Complete by: As directed    Call MD for:  persistant dizziness  or light-headedness   Complete by: As directed    Call MD for:  persistant nausea and vomiting   Complete by: As directed    Call MD for:  redness, tenderness, or signs of infection (pain, swelling, redness, odor or green/yellow discharge around incision site)   Complete by: As directed    Call MD for:  severe uncontrolled pain   Complete by: As directed    Call MD for:  temperature >100.4   Complete by: As directed    Diet - low sodium heart healthy   Complete by: As directed    Driving Restrictions   Complete by: As directed    No driving for around 1 week(s).  Do not take narcotics and drive. You need to make sure your reaction time has returned.   Increase activity slowly   Complete by: As directed    Lifting restrictions   Complete by: As directed    No lifting greater than 10 lbs, pushing, pulling, straining for 6 weeks.   No dressing needed   Complete by: As directed    Sexual Activity Restrictions   Complete by: As directed    No sexual activity, nothing in the vagina, for 8 weeks.      Allergies as of 03/10/2022       Reactions   Codeine Nausea Only   Norvasc [amlodipine] Other (See Comments)   Unknown reaction--"pt states she overdosed on it"        Medication List     STOP taking these medications    aspirin 81 MG chewable tablet   SAW PALMETTO PO       TAKE these medications    carboxymethylcellul-glycerin 0.5-0.9 %  ophthalmic solution Commonly known as: REFRESH OPTIVE Place 1-2 drops into both eyes 3 (three) times daily as needed (dry/irritated eyes.).   cholecalciferol 25 MCG (1000 UNIT) tablet Commonly known as: VITAMIN D Take 1,000 Units by mouth daily after lunch.   IMMUNE SUPPORT PO Take 4 oz by mouth in the morning. Zradical Youngevity Pure Fucoidan Health Drink-Immune Support Supplement   lisinopril 10 MG tablet Commonly known as: ZESTRIL Take 10 mg by mouth every evening.   senna-docusate 8.6-50 MG tablet Commonly known as:  Senokot-S Take 2 tablets by mouth at bedtime. For AFTER surgery, do not take if having diarrhea   traMADol 50 MG tablet Commonly known as: ULTRAM Take 1 tablet (50 mg total) by mouth every 6 (six) hours as needed for severe pain. For AFTER surgery only, do not take and drive               Discharge Care Instructions  (From admission, onward)           Start     Ordered   03/10/22 0000  No dressing needed        03/10/22 4970            Follow-up Information     Alyssa Mosses, MD Follow up on 03/17/2022.   Specialty: Gynecologic Oncology Why: at 3:30pm will be a PHONE call visit to check in with Dr. Berline Lopes and discuss final pathology. IN PERSON visit will be on 03/29/22 at 3:45pm at the [facility] for post-op check. Contact information: [address] [place] [address] [phone]                 Greater than thirty minutes were spend for face to face discharge instructions and discharge orders/summary in EPIC.   Signed: Dorothyann Taylor 03/10/2022, 10:20 AM

## 2022-03-10 NOTE — Progress Notes (Signed)
Transition of Care [facility]) Screening Note  Patient Details  Name: Alyssa Taylor Date of Birth: 1941/05/21  Transition of Care [facility]) CM/SW Contact:    Sherie Don, LCSW Phone Number: 03/10/2022, 8:51 AM  Transition of Care Department [facility]) has reviewed patient and no TOC needs have been identified at this time. We will continue to monitor patient advancement through interdisciplinary progression rounds. If new patient transition needs arise, please place a TOC consult.

## 2022-03-11 ENCOUNTER — Telehealth: Payer: Self-pay

## 2022-03-11 ENCOUNTER — Telehealth: Payer: Self-pay | Admitting: Gynecologic Oncology

## 2022-03-11 NOTE — Telephone Encounter (Signed)
Attempted to reach patient to check in with her post-operatively. Unable to reach patient on her cell number. Left message requesting return call.  Home number is not in-service.

## 2022-03-11 NOTE — Telephone Encounter (Signed)
Spoke with Alyssa Taylor this afternoon. She states she is eating, drinking and urinating well. She has not had a BM yet but is passing gas. She is taking senokot as prescribed and encouraged her to drink plenty of water and be up and moving throughout the day. She denies fever or chills. Incisions are dry and intact. She reports her abdomen is "swollen". Denies abdominal pain, does not feel like her abdomen feels firm. She rates her pain 0/10. She has taken one tramadol since she's been home.   Instructed patient that someone from the office will call and check in with her periodically. Patient is ok with this. Advised to call with any needs.   Instructed to call office with any fever, chills, purulent drainage, uncontrolled pain or any other questions or concerns. Patient verbalizes understanding.   Pt aware of post op appointments as well as the office number [phone] and after hours number [phone] to call if she has any questions or concerns

## 2022-03-11 NOTE — Telephone Encounter (Signed)
Spoke with the patient. Doing well at home. + flatus, no nausea. Started sennakot today. Denies fevers/chills.  Jeral Pinch MD Gynecologic Oncology

## 2022-03-12 ENCOUNTER — Telehealth: Payer: Self-pay

## 2022-03-12 NOTE — Telephone Encounter (Signed)
Called patient to see how she was doing after surgery.  LVM for return call.

## 2022-03-12 NOTE — Telephone Encounter (Signed)
Received call from patient.  She is doing well.  Patient is eating, drinking, passing gas and had BM.  Patient denies pain. Informed patient to continue Senakot and make sure she is drinking lots of fluids. Patient to call with any concerns or questions.

## 2022-03-17 ENCOUNTER — Inpatient Hospital Stay (HOSPITAL_BASED_OUTPATIENT_CLINIC_OR_DEPARTMENT_OTHER): Payer: Medicare Other | Admitting: Gynecologic Oncology

## 2022-03-17 ENCOUNTER — Encounter: Payer: Self-pay | Admitting: Gynecologic Oncology

## 2022-03-17 DIAGNOSIS — Z7189 Other specified counseling: Secondary | ICD-10-CM

## 2022-03-17 DIAGNOSIS — C541 Malignant neoplasm of endometrium: Secondary | ICD-10-CM

## 2022-03-17 DIAGNOSIS — Z90722 Acquired absence of ovaries, bilateral: Secondary | ICD-10-CM

## 2022-03-17 DIAGNOSIS — Z90710 Acquired absence of both cervix and uterus: Secondary | ICD-10-CM

## 2022-03-17 NOTE — Progress Notes (Signed)
Gynecologic Oncology Telehealth Note: Gyn-Onc  I connected with Alyssa Taylor on 03/17/22 at  3:30 PM EDT by telephone and verified that I am speaking with the correct person using two identifiers.  I discussed the limitations, risks, security and privacy concerns of performing an evaluation and management service by telemedicine and the availability of in-person appointments. I also discussed with the patient that there may be a patient responsible charge related to this service. The patient expressed understanding and agreed to proceed.  Other persons participating in the visit and their role in the encounter: none.  Patient's location: home Provider's location: [facility]  Reason for Visit: Follow-up after surgery, pathology review, treatment planning  Treatment History: Oncology History  Endometrial cancer [facility])   Initial Diagnosis   Endometrial cancer ([place])    Initial Biopsy   EMB: CAH, at least   12/16/2021 Surgery   Hysteroscopy, D&C. Pathology revealed grade 1 endometrioid adenocarcinoma.   03/09/2022 Surgery   Robotic-assisted laparoscopic total hysterectomy with bilateral salpingo-oophorectomy, SLN biopsy bilaterally and excision of additional prominent lymph node, lysis of adhesions for 45 minutes, cystoscopy  Findings: On EUA, mobile 10 cm uterus.  On intra-abdominal entry, normal upper abdominal survey including diaphragm, liver edge, and stomach.  Normal-appearing small bowel and omentum.  Sigmoid colon with significant adhesions to the left pelvic sidewall and posterior uterus/cervix, suspected to be related to prior incidence of diverticulitis given diverticular disease noted.  Left ovary somewhat enlarged for postmenopausal status and again adherent to the colon.  Right adnexa atrophic and mildly abnormal fallopian tube.  Uterus approximately 10 cm and somewhat bulbous at the fundus.  Mapping successful to bilateral pelvic basins, right obturator sentinel lymph node and left  external iliac sentinel lymph node.  Additional green lymph node noted at the bifurcation of the external and internal iliac arteries (resected as mildly prominent).  On cystoscopy, bladder dome intact, good efflux noted from bilateral ureteral orifices.    Pathology Results   Stage IB, grade 1 endometrioid 83% MI No LVSI ITCs in 1 of 3 SLNs     Interval History: Doing well. Reports good bowel and bladder function. Denies pain. Appetite good.   Past Medical/Surgical History: Past Medical History:  Diagnosis Date   Arthritis    right knee   Cancer ([place])    Hernia, hiatal    Hypertension    Stroke [facility])    Vertigo     Past Surgical History:  Procedure Laterality Date   DILATATION & CURETTAGE/HYSTEROSCOPY WITH MYOSURE N/A 12/16/2021   Procedure: DILATATION & CURETTAGE OF UTERUS;  HYSTEROSCOPY;  MYOSURE;  Surgeon: [name], MD;  Location: [place];  Service: Gynecology;  Laterality: N/A;   DILATION AND CURETTAGE OF UTERUS N/A 1975   after a pregnancy   LYMPH NODE DISSECTION N/A 03/09/2022   Procedure: LYMPH NODE DISSECTION;  Surgeon: [name], MD;  Location: [facility];  Service: Gynecology;  Laterality: N/A;   ROBOTIC ASSISTED TOTAL HYSTERECTOMY WITH BILATERAL SALPINGO OOPHERECTOMY Bilateral 03/09/2022   Procedure: XI ROBOTIC ASSISTED TOTAL HYSTERECTOMY WITH BILATERAL SALPINGO OOPHORECTOMY, CYSTOSCOPY;  Surgeon: [name], MD;  Location: [facility];  Service: Gynecology;  Laterality: Bilateral;   SENTINEL NODE BIOPSY N/A 03/09/2022   Procedure: SENTINEL NODE BIOPSY;  Surgeon: [name], MD;  Location: [facility];  Service: Gynecology;  Laterality: N/A;   TONSILLECTOMY N/A 1988   TUBAL LIGATION  1984   WISDOM TOOTH EXTRACTION      Family History  Problem  Relation Age of Onset   Colon cancer Maternal Grandmother    Breast cancer Neg Hx    Ovarian cancer Neg Hx    Endometrial cancer Neg Hx    Pancreatic cancer Neg Hx     Prostate cancer Neg Hx     Social History   Socioeconomic History   Marital status: Divorced    Spouse name: Not on file   Number of children: Not on file   Years of education: Not on file   Highest education level: Not on file  Occupational History   Not on file  Tobacco Use   Smoking status: Never   Smokeless tobacco: Never  Vaping Use   Vaping Use: Never used  Substance and Sexual Activity   Alcohol use: Not Currently   Drug use: Never   Sexual activity: Yes  Other Topics Concern   Not on file  Social History Narrative   Not on file   Social Determinants of Health   Financial Resource Strain: Not on file  Food Insecurity: Not on file  Transportation Needs: Not on file  Physical Activity: Not on file  Stress: Not on file  Social Connections: Not on file    Current Medications:  Current Outpatient Medications:    carboxymethylcellul-glycerin (REFRESH OPTIVE) 0.5-0.9 % ophthalmic solution, Place 1-2 drops into both eyes 3 (three) times daily as needed (dry/irritated eyes.)., Disp: , Rfl:    cholecalciferol (VITAMIN D) 25 MCG (1000 UNIT) tablet, Take 1,000 Units by mouth daily after lunch., Disp: , Rfl:    lisinopril (ZESTRIL) 10 MG tablet, Take 10 mg by mouth every evening., Disp: , Rfl:    Multiple Vitamins-Minerals (IMMUNE SUPPORT PO), Take 4 oz by mouth in the morning. Zradical Youngevity Pure Fucoidan Health Drink-Immune Support Supplement, Disp: , Rfl:    senna-docusate (SENOKOT-S) 8.6-50 MG tablet, Take 2 tablets by mouth at bedtime. For AFTER surgery, do not take if having diarrhea, Disp: 30 tablet, Rfl: 0   traMADol (ULTRAM) 50 MG tablet, Take 1 tablet (50 mg total) by mouth every 6 (six) hours as needed for severe pain. For AFTER surgery only, do not take and drive, Disp: 10 tablet, Rfl: 0  Review of Symptoms: Pertinent positives as per HPI.  Physical Exam: LMP  (LMP Unknown)  Deferred given limitations of phone visit.  Laboratory & Radiologic  Studies: See above  Assessment & Plan: Alyssa Taylor is a 81 y.o. woman with Stage IBi+ grade 1 endometrioid endometrial adenocarcinoma who presents for telephone follow-up.  Patient is overall doing well, meeting postoperative milestones.  Discussed continued expectations.  I reviewed her pathology in detail which shows low-grade tumor with outer half myometrial invasion.  1 of 3 sentinel lymph nodes with isolated tumor cells.  Given high-intermediate risk factors due to deeper myometrial invasion and the patient's age, I recommend adjuvant brachytherapy.  We will plan to present the patient's case at tumor board and get her scheduled to see Dr. Sondra Come for consultation.  I discussed the assessment and treatment plan with the patient. The patient was provided with an opportunity to ask questions and all were answered. The patient agreed with the plan and demonstrated an understanding of the instructions.   The patient was advised to call back or see an in-person evaluation if the symptoms worsen or if the condition fails to improve as anticipated.   12 minutes of total time was spent for this patient encounter, including preparation, phone counseling with the patient and coordination of care, and  documentation of the encounter.   Jeral Pinch, MD  Division of Gynecologic Oncology  Department of Obstetrics and Gynecology  [facility]

## 2022-03-19 ENCOUNTER — Telehealth: Payer: Self-pay | Admitting: *Deleted

## 2022-03-19 NOTE — Telephone Encounter (Signed)
Spoke with pt this afternoon who stated that she's doing good. She is passing gas, having normal BMs, and denies pain, abdominal pain, or any abnormal symptoms. Pt was wondering when she would be seeing the radiation doctor. Informed pt that the doctors are still reviewing it and will reach out to her sometime next week. She verbalized understanding. Warner Mccreedy, NP notified.

## 2022-03-22 ENCOUNTER — Other Ambulatory Visit: Payer: Self-pay | Admitting: Oncology

## 2022-03-22 DIAGNOSIS — C541 Malignant neoplasm of endometrium: Secondary | ICD-10-CM

## 2022-03-25 ENCOUNTER — Encounter: Payer: Self-pay | Admitting: Gynecologic Oncology

## 2022-03-26 ENCOUNTER — Encounter: Payer: Self-pay | Admitting: Gynecologic Oncology

## 2022-03-29 ENCOUNTER — Inpatient Hospital Stay: Payer: Medicare Other | Attending: Gynecologic Oncology | Admitting: Gynecologic Oncology

## 2022-03-29 ENCOUNTER — Encounter: Payer: Self-pay | Admitting: Gynecologic Oncology

## 2022-03-29 VITALS — BP 132/65 | HR 76 | Temp 98.2°F | Resp 16 | Ht 62.0 in | Wt 197.9 lb

## 2022-03-29 DIAGNOSIS — Z90710 Acquired absence of both cervix and uterus: Secondary | ICD-10-CM

## 2022-03-29 DIAGNOSIS — C541 Malignant neoplasm of endometrium: Secondary | ICD-10-CM

## 2022-03-29 DIAGNOSIS — Z7189 Other specified counseling: Secondary | ICD-10-CM

## 2022-03-29 DIAGNOSIS — Z90722 Acquired absence of ovaries, bilateral: Secondary | ICD-10-CM

## 2022-03-29 NOTE — Progress Notes (Signed)
Gynecologic Oncology Return Clinic Visit  03/29/2022  Reason for Visit: Follow-up after recent surgery, treatment planning  Treatment History: Oncology History  Endometrial cancer [facility])   Initial Diagnosis   Endometrial cancer [facility])    Initial Biopsy   EMB: CAH, at least   12/16/2021 Surgery   Hysteroscopy, D&C. Pathology revealed grade 1 endometrioid adenocarcinoma.   03/09/2022 Surgery   Robotic-assisted laparoscopic total hysterectomy with bilateral salpingo-oophorectomy, SLN biopsy bilaterally and excision of additional prominent lymph node, lysis of adhesions for 45 minutes, cystoscopy  Findings: On EUA, mobile 10 cm uterus.  On intra-abdominal entry, normal upper abdominal survey including diaphragm, liver edge, and stomach.  Normal-appearing small bowel and omentum.  Sigmoid colon with significant adhesions to the left pelvic sidewall and posterior uterus/cervix, suspected to be related to prior incidence of diverticulitis given diverticular disease noted.  Left ovary somewhat enlarged for postmenopausal status and again adherent to the colon.  Right adnexa atrophic and mildly abnormal fallopian tube.  Uterus approximately 10 cm and somewhat bulbous at the fundus.  Mapping successful to bilateral pelvic basins, right obturator sentinel lymph node and left external iliac sentinel lymph node.  Additional green lymph node noted at the bifurcation of the external and internal iliac arteries (resected as mildly prominent).  On cystoscopy, bladder dome intact, good efflux noted from bilateral ureteral orifices.    Pathology Results   Stage IB, grade 1 endometrioid 83% MI No LVSI ITCs in 1 of 3 SLNs     Interval History: Doing well.  Denies any abdominal or pelvic pain.  Reports baseline bowel and bladder function.  Appetite has been good, denies nausea or emesis.  Denies any vaginal bleeding or discharge.  Is being careful not to do any heavy lifting.  Past Medical/Surgical  History: Past Medical History:  Diagnosis Date   Arthritis    right knee   Cancer ([place])    Hernia, hiatal    Hypertension    Stroke [facility])    Vertigo     Past Surgical History:  Procedure Laterality Date   DILATATION & CURETTAGE/HYSTEROSCOPY WITH MYOSURE N/A 12/16/2021   Procedure: DILATATION & CURETTAGE OF UTERUS;  HYSTEROSCOPY;  MYOSURE;  Surgeon: [name], MD;  Location: [place];  Service: Gynecology;  Laterality: N/A;   DILATION AND CURETTAGE OF UTERUS N/A 1975   after a pregnancy   LYMPH NODE DISSECTION N/A 03/09/2022   Procedure: LYMPH NODE DISSECTION;  Surgeon: [name], MD;  [facility];  Service: Gynecology;  Laterality: N/A;   ROBOTIC ASSISTED TOTAL HYSTERECTOMY WITH BILATERAL SALPINGO OOPHERECTOMY Bilateral 03/09/2022   Procedure: XI ROBOTIC ASSISTED TOTAL HYSTERECTOMY WITH BILATERAL SALPINGO OOPHORECTOMY, CYSTOSCOPY;  Surgeon: [name], MD;  [facility];  Service: Gynecology;  Laterality: Bilateral;   SENTINEL NODE BIOPSY N/A 03/09/2022   Procedure: SENTINEL NODE BIOPSY;  Surgeon: [name], MD;  [facility];  Service: Gynecology;  Laterality: N/A;   TONSILLECTOMY N/A 1988   TUBAL LIGATION  1984   WISDOM TOOTH EXTRACTION      Family History  Problem Relation Age of Onset   Colon cancer Maternal Grandmother    Breast cancer Neg Hx    Ovarian cancer Neg Hx    Endometrial cancer Neg Hx    Pancreatic cancer Neg Hx    Prostate cancer Neg Hx     Social History   Socioeconomic History   Marital status: Divorced    Spouse name: Not on file  Number of children: Not on file   Years of education: Not on file   Highest education level: Not on file  Occupational History   Not on file  Tobacco Use   Smoking status: Never   Smokeless tobacco: Never  Vaping Use   Vaping Use: Never used  Substance and Sexual Activity   Alcohol use: Not Currently   Drug use: Never   Sexual activity: Yes   Other Topics Concern   Not on file  Social History Narrative   Not on file   Social Determinants of Health   Financial Resource Strain: Not on file  Food Insecurity: Not on file  Transportation Needs: Not on file  Physical Activity: Not on file  Stress: Not on file  Social Connections: Not on file    Current Medications:  Current Outpatient Medications:    carboxymethylcellul-glycerin (REFRESH OPTIVE) 0.5-0.9 % ophthalmic solution, Place 1-2 drops into both eyes 3 (three) times daily as needed (dry/irritated eyes.)., Disp: , Rfl:    cholecalciferol (VITAMIN D) 25 MCG (1000 UNIT) tablet, Take 1,000 Units by mouth daily after lunch., Disp: , Rfl:    lisinopril (ZESTRIL) 10 MG tablet, Take 10 mg by mouth every evening., Disp: , Rfl:    senna-docusate (SENOKOT-S) 8.6-50 MG tablet, Take 2 tablets by mouth at bedtime. For AFTER surgery, do not take if having diarrhea (Patient not taking: Reported on 03/25/2022), Disp: 30 tablet, Rfl: 0  Review of Systems: Denies appetite changes, fevers, chills, fatigue, unexplained weight changes. Denies hearing loss, neck lumps or masses, mouth sores, ringing in ears or voice changes. Denies cough or wheezing.  Denies shortness of breath. Denies chest pain or palpitations. Denies leg swelling. Denies abdominal distention, pain, blood in stools, constipation, diarrhea, nausea, vomiting, or early satiety. Denies pain with intercourse, dysuria, frequency, hematuria or incontinence. Denies hot flashes, pelvic pain, vaginal bleeding or vaginal discharge.   Denies joint pain, back pain or muscle pain/cramps. Denies itching, rash, or wounds. Denies dizziness, headaches, numbness or seizures. Denies swollen lymph nodes or glands, denies easy bruising or bleeding. Denies anxiety, depression, confusion, or decreased concentration.  Physical Exam: BP 132/65 (BP Location: Left Arm, Patient Position: Sitting)   Pulse 76   Temp 98.2 F (36.8 C) (Tympanic)    Resp 16   Ht 5' 2"  (1.575 m)   Wt 197 lb 14.4 oz (89.8 kg)   LMP  (LMP Unknown)   SpO2 98%   BMI 36.20 kg/m  General: Alert, oriented, no acute distress. HEENT: Normocephalic, atraumatic, sclera anicteric. Chest: Unlabored breathing on room air. Abdomen: Obese, soft, nontender.  Normoactive bowel sounds.  No masses or hepatosplenomegaly appreciated.  Well-healed incisions.  Remaining Dermabond removed and 2 mattress stitches excised. Extremities: Grossly normal range of motion.  Warm, well perfused.  No edema bilaterally. GU: Normal appearing external genitalia without erythema, excoriation, or lesions.  Speculum exam reveals cuff intact, suture visible.  Bimanual exam reveals no fluctuance or tenderness on palpation, cuff intact.   Laboratory & Radiologic Studies: None new  Assessment & Plan: Ja Pistole is a 81 y.o. woman with Stage Ibi+ grade 1 endometrioid adenocarcinoma who presents for follow-up after surgery. MMR intact. MSS.  Patient is doing well from a postoperative standpoint, meeting all milestones.  Discussed continued activity restrictions and postoperative expectations.  Reviewed her pathology report with her in detail.  Given high-intermediate risk factors, I have recommended adjuvant brachytherapy.  We discussed isolated tumor cells seen in one of her sentinel lymph nodes.  Based on the data we have, this does not appear to impact oncologic outcomes and we treat patients based on uterine factors.  Patient has an appointment with Dr. Sondra Come in the next couple of weeks.  I will see her back for follow-up when she finishes radiation.  24 minutes of total time was spent for this patient encounter, including preparation, face-to-face counseling with the patient and coordination of care, and documentation of the encounter.  Jeral Pinch, MD  Division of Gynecologic Oncology  Department of Obstetrics and Gynecology  [facility]

## 2022-03-29 NOTE — Patient Instructions (Signed)
It was good to see you today.  You are healing well from surgery.  Please remember, no heavy lifting until 6 weeks after surgery and nothing in the vagina for at least 8 weeks.  I will see you back after you have finished your vaginal radiation treatment.

## 2022-03-31 ENCOUNTER — Telehealth: Payer: Self-pay

## 2022-03-31 LAB — SURGICAL PATHOLOGY

## 2022-03-31 NOTE — Telephone Encounter (Signed)
Received call from Alyssa Taylor stating she is out of senokot and has not had a BM for 3 days. She states " I'm constipated and uncomfortable". Patient reports she is doing well otherwise. [name], NP notified. Advised patient that she can purchase senokot over the counter. She can also incorporate miralax daily to prevent constipation. She needs to be drinking plenty of fluids and eating enough fiber. Patient verbalized understanding, instructed to call with any needs.

## 2022-03-31 NOTE — Telephone Encounter (Signed)
Attempted to return patients phone call. Unable to contact patient. Left message requesting return call.

## 2022-04-01 ENCOUNTER — Ambulatory Visit: Payer: Medicare Other | Admitting: Radiation Oncology

## 2022-04-01 ENCOUNTER — Telehealth: Payer: Self-pay

## 2022-04-01 ENCOUNTER — Ambulatory Visit: Payer: Medicare Other

## 2022-04-01 DIAGNOSIS — C541 Malignant neoplasm of endometrium: Secondary | ICD-10-CM

## 2022-04-01 NOTE — Telephone Encounter (Signed)
Received call from Ms.Sherbert this afternoon. Patient states " I can't remember if I called yesterday but I feel constipated and have not been able to have a bowel movement." RN inquired if patient picked up senokot from the pharmacy as we discussed yesterday or if she has taken any type of laxative. "No, I haven't gone to the pharmacy and I haven't taken anything." Patient denies any nausea or vomiting. She reports a reduced appetite and pain in her "colon". She endorses abdominal bloating and is passing gas. Advised patient that she needs to go to the pharmacy and get senokot or laxative of some sort. She needs to drink at least 64 ounces of water a day, eat fresh fruits and vegetables and be up and moving. Patient verbalized understanding. Instructed patient that someone from the office will call and check in with her tomorrow and to call with any needs.

## 2022-04-02 NOTE — Telephone Encounter (Signed)
Spoke with pt this morning who stated that she went to the pharmacy and picked up the stool softener and took 1. She has had a small BM (small thin stool) this morning. She stated her diverticulitis has been flaring up and it always causes her pain and constipation. She she experiences the pain it's a 10/10 and comes and goes and is not currently in any pain. She does not take anything for it. Encouraged pt to continue the stool softener to help with her constipation if she feels like she needs it and continue to stay hydrated. She can also follow up with her PCP if the diverticulitis is bothering her a lot. She stated she will follow up if she needs and she is going to eat some pineapple today to help with the diverticulitis. Pt verbalized understanding.

## 2022-04-06 NOTE — Progress Notes (Signed)
Radiation Oncology         [phone] ________________________________  Initial Outpatient Consultation  Name: Alyssa Taylor MRN: [id number]  Date: 04/07/2022  DOB: September 14, 1941  [id number], Leonia Reader, FNP  [name], MD   REFERRING PHYSICIAN: [name], MD  DIAGNOSIS: There were no encounter diagnoses.  FIGO grade 1 endometrioid carcinoma with 85% myometrial invasion; MSI stable  HISTORY OF PRESENT ILLNESS::Alyssa Taylor is a 81 y.o. female who is accompanied by ***. she is seen as a courtesy of Dr. Berline Lopes for an opinion concerning radiation therapy as part of management for her recently diagnosed endometrial cancer.   The patient presented to her OB/GYN, Dr. Dion Body, this past February with the cc of postmenopausal bleeding. Endometrial biopsy collected on 11/04/21 showed findings consistent with at least complex hyperplasia with atypia. Per her most recent available follow-up per EMR with Dr. Dion Body on 11/23/21, options such as surgical intervention and progesterone with discussed with the patient.   The patient was then referred to Dr. Berline Lopes on 12/07/21 for further evaluation. During this visit, the patient reported an episode of postmenopausal bleeding last year that stopped spontaneously.  The patient also reported experiencing postmenopausal bleeding and pelvic pain this past February 2023.  Her initial episode lasted for 5 days which consisted of bright red bleeding. Following her recent biopsy, the patient denied any further bleeding but endorsed some mid pelvic pain. Following discussion with Dr. Berline Lopes, the patient agreed to proceed with [facility] for further tissue sampling, along with Mirena IUD insertion, followed by robotic assisted total laparoscopic hysterectomy, BSO, sentinel lymph node biopsy (and possible lymph node dissection and laparotomy).   Pelvic ultrasound performed on 12/15/21 demonstrated the heterogeneous mass in the central aspect of the  lower uterus measuring up to 2.8 cm. Diffuse thickening throughout the endometrium was also appreciated (endometrial thickness of 2.5 cm). [place] also showed a normal appearing left ovary. Right ovary was not appreciated.    Endometrial curettage performed on 12/16/21 by Dr. Berline Lopes revealed FIGO grade 1 focal well differentiated endometrioid adenocarcinoma, with a background of complex atypical hyperplasia.  The patient opted to proceed with hysterectomy, BSO, and nodal biopsies on 03/09/22. Pathology from the procedure revealed FIGO grade 1 endometrioid carcinoma with 85% myometrial invasion (MSI stable, MMR normal). Nodal status of 3/3 sentinel lymph nodes negative for carcinoma (1 right obturator SLN with isolated tumor cells). Cervix, bilateral ovaries, and bilateral fallopian tubes all negative for carcinoma.   (Patient was scheduled to undergo surgery earlier but canceled).   Given high-intermediate risk factors due to significant myometrial invasion and the patient's age, Dr. Berline Lopes advised the patient to proceed with adjuvant brachytherapy which she is here today to discuss. The patient's case was also presented at the tumor board held on 03/22/22, with disposition concluded to vaginal brachytherapy.   PREVIOUS RADIATION THERAPY: No  PAST MEDICAL HISTORY:  Past Medical History:  Diagnosis Date   Arthritis    right knee   Cancer ([place])    Hernia, hiatal    Hypertension    Stroke ([place])    Vertigo     PAST SURGICAL HISTORY: Past Surgical History:  Procedure Laterality Date   DILATATION & CURETTAGE/HYSTEROSCOPY WITH MYOSURE N/A 12/16/2021   Procedure: DILATATION & CURETTAGE OF UTERUS;  HYSTEROSCOPY;  MYOSURE;  Surgeon: [name], MD;  Location: [place];  Service: Gynecology;  Laterality: N/A;   DILATION AND CURETTAGE OF UTERUS N/A 1975   after a pregnancy   LYMPH  NODE DISSECTION N/A 03/09/2022   Procedure: LYMPH NODE DISSECTION;  Surgeon: [name], MD;  [facility];  Service: Gynecology;  Laterality: N/A;   ROBOTIC ASSISTED TOTAL HYSTERECTOMY WITH BILATERAL SALPINGO OOPHERECTOMY Bilateral 03/09/2022   Procedure: XI ROBOTIC ASSISTED TOTAL HYSTERECTOMY WITH BILATERAL SALPINGO OOPHORECTOMY, CYSTOSCOPY;  Surgeon: [name], MD;  [facility];  Service: Gynecology;  Laterality: Bilateral;   SENTINEL NODE BIOPSY N/A 03/09/2022   Procedure: SENTINEL NODE BIOPSY;  Surgeon: [name], MD;  [facility];  Service: Gynecology;  Laterality: N/A;   TONSILLECTOMY N/A 1988   TUBAL LIGATION  1984   WISDOM TOOTH EXTRACTION      FAMILY HISTORY:  Family History  Problem Relation Age of Onset   Colon cancer Maternal Grandmother    Breast cancer Neg Hx    Ovarian cancer Neg Hx    Endometrial cancer Neg Hx    Pancreatic cancer Neg Hx    Prostate cancer Neg Hx     SOCIAL HISTORY:  Social History   Tobacco Use   Smoking status: Never   Smokeless tobacco: Never  Vaping Use   Vaping Use: Never used  Substance Use Topics   Alcohol use: Not Currently   Drug use: Never    ALLERGIES:  Allergies  Allergen Reactions   Codeine Nausea Only   Norvasc [Amlodipine] Other (See Comments)    Unknown reaction--"pt states she overdosed on it"    MEDICATIONS:  Current Outpatient Medications  Medication Sig Dispense Refill   carboxymethylcellul-glycerin (REFRESH OPTIVE) 0.5-0.9 % ophthalmic solution Place 1-2 drops into both eyes 3 (three) times daily as needed (dry/irritated eyes.).     cholecalciferol (VITAMIN D) 25 MCG (1000 UNIT) tablet Take 1,000 Units by mouth daily after lunch.     lisinopril (ZESTRIL) 10 MG tablet Take 10 mg by mouth every evening.     senna-docusate (SENOKOT-S) 8.6-50 MG tablet Take 2 tablets by mouth at bedtime. For AFTER surgery, do not take if having diarrhea (Patient not taking: Reported on 03/25/2022) 30 tablet 0   No current facility-administered medications for this encounter.    REVIEW  OF SYSTEMS:  A 10+ POINT REVIEW OF SYSTEMS WAS OBTAINED including neurology, dermatology, psychiatry, cardiac, respiratory, lymph, extremities, GI, GU, musculoskeletal, constitutional, reproductive, HEENT. ***   PHYSICAL EXAM:  vitals were not taken for this visit.   General: Alert and oriented, in no acute distress HEENT: Head is normocephalic. Extraocular movements are intact. Oropharynx is clear. Neck: Neck is supple, no palpable cervical or supraclavicular lymphadenopathy. Heart: Regular in rate and rhythm with no murmurs, rubs, or gallops. Chest: Clear to auscultation bilaterally, with no rhonchi, wheezes, or rales. Abdomen: Soft, nontender, nondistended, with no rigidity or guarding. Extremities: No cyanosis or edema. Lymphatics: see Neck Exam Skin: No concerning lesions. Musculoskeletal: symmetric strength and muscle tone throughout. Neurologic: Cranial nerves II through XII are grossly intact. No obvious focalities. Speech is fluent. Coordination is intact. Psychiatric: Judgment and insight are intact. Affect is appropriate.  On pelvic examination the external genitalia were unremarkable. A speculum exam was performed. There are no mucosal lesions noted in the vaginal vault. A Pap smear was obtained of the proximal vagina. On bimanual and rectovaginal examination there were no pelvic masses appreciated. ***   ECOG = ***  0 - Asymptomatic (Fully active, able to carry on all predisease activities without restriction)  1 - Symptomatic but completely ambulatory (Restricted in physically strenuous activity but ambulatory and able to carry out  work of a light or sedentary nature. For example, light housework, office work)  2 - Symptomatic, <50% in bed during the day (Ambulatory and capable of all self care but unable to carry out any work activities. Up and about more than 50% of waking hours)  3 - Symptomatic, >50% in bed, but not bedbound (Capable of only limited self-care, confined to  bed or chair 50% or more of waking hours)  4 - Bedbound (Completely disabled. Cannot carry on any self-care. Totally confined to bed or chair)  5 - Death   Eustace Pen MM, Creech RH, Tormey DC, et al. [phone]). "Toxicity and response criteria of the [facility]". [place] Oncol. 5 (6): 649-55  LABORATORY DATA:  Lab Results  Component Value Date   WBC 8.2 03/10/2022   HGB 13.0 03/10/2022   HCT 40.2 03/10/2022   MCV 92.4 03/10/2022   PLT 295 03/10/2022   Lab Results  Component Value Date   NA 137 03/10/2022   K 4.3 03/10/2022   CL 107 03/10/2022   CO2 24 03/10/2022   GLUCOSE 201 (H) 03/10/2022   BUN 11 03/10/2022   CREATININE 0.61 03/10/2022   CALCIUM 8.9 03/10/2022      RADIOGRAPHY: No results found.    IMPRESSION: FIGO grade 1 endometrioid carcinoma with 85% myometrial invasion; MSI stable  ***  Today, I talked to the patient and family about the findings and work-up thus far.  We discussed the natural history of *** and general treatment, highlighting the role of radiotherapy in the management.  We discussed the available radiation techniques, and focused on the details of logistics and delivery.  We reviewed the anticipated acute and late sequelae associated with radiation in this setting.  The patient was encouraged to ask questions that I answered to the best of my ability. *** A patient consent form was discussed and signed.  We retained a copy for our records.  The patient would like to proceed with radiation and will be scheduled for CT simulation.  PLAN: ***    *** minutes of total time was spent for this patient encounter, including preparation, face-to-face counseling with the patient and coordination of care, physical exam, and documentation of the encounter.   ------------------------------------------------  Blair Promise, PhD, MD  This document serves as a record of services personally performed by Gery Pray, MD. It was created on  his behalf by Roney Mans, a trained medical scribe. The creation of this record is based on the scribe's personal observations and the provider's statements to them. This document has been checked and approved by the attending provider.

## 2022-04-07 ENCOUNTER — Encounter: Payer: Self-pay | Admitting: Radiation Oncology

## 2022-04-07 ENCOUNTER — Ambulatory Visit
Admission: RE | Admit: 2022-04-07 | Discharge: 2022-04-07 | Disposition: A | Discharge status: Home / Self Care | Payer: Medicare Other | Source: Ambulatory Visit | Attending: Radiation Oncology | Admitting: Radiation Oncology

## 2022-04-07 ENCOUNTER — Other Ambulatory Visit: Payer: Self-pay

## 2022-04-07 VITALS — BP 139/96 | HR 73 | Temp 97.8°F | Wt 194.4 lb

## 2022-04-07 DIAGNOSIS — Z79899 Other long term (current) drug therapy: Secondary | ICD-10-CM | POA: Diagnosis not present

## 2022-04-07 DIAGNOSIS — C541 Malignant neoplasm of endometrium: Secondary | ICD-10-CM | POA: Insufficient documentation

## 2022-04-07 DIAGNOSIS — Z800 Family history of malignant neoplasm of digestive organs: Secondary | ICD-10-CM | POA: Insufficient documentation

## 2022-04-07 DIAGNOSIS — I10 Essential (primary) hypertension: Secondary | ICD-10-CM | POA: Insufficient documentation

## 2022-04-07 DIAGNOSIS — Z8673 Personal history of transient ischemic attack (TIA), and cerebral infarction without residual deficits: Secondary | ICD-10-CM | POA: Diagnosis not present

## 2022-04-07 DIAGNOSIS — M129 Arthropathy, unspecified: Secondary | ICD-10-CM | POA: Diagnosis not present

## 2022-04-07 DIAGNOSIS — Z90710 Acquired absence of both cervix and uterus: Secondary | ICD-10-CM | POA: Insufficient documentation

## 2022-04-07 DIAGNOSIS — Z90722 Acquired absence of ovaries, bilateral: Secondary | ICD-10-CM | POA: Diagnosis not present

## 2022-04-07 NOTE — Progress Notes (Signed)
GYN Location of Tumor / Histology: Stage Ibi+ grade 1 endometrioid adenocarcinoma  Alyssa Taylor presented with symptoms of: vaginal bleeding  Biopsies revealed: Endometrioid carcinoma  Past/Anticipated interventions by Gyn/Onc surgery, if any: Robotic-assisted laparoscopic total hysterectomy with bilateral salpingo-oophorectomy, SLN biopsy bilaterally and excision of additional prominent lymph node, lysis of adhesions for 45 minutes, cystoscopy 03/09/2022    Past/Anticipated interventions by medical oncology, if any: no  Weight changes, if any: yes - 9 lbs since surgery  Bowel/Bladder complaints, if any: no   Nausea/Vomiting, if any: no  Pain issues, if any:  no  SAFETY ISSUES: Prior radiation? no Pacemaker/ICD? no Possible current pregnancy? no Is the patient on methotrexate? no  Current Complaints / other details:  None noted

## 2022-04-08 ENCOUNTER — Ambulatory Visit: Payer: Medicare Other

## 2022-04-08 ENCOUNTER — Ambulatory Visit: Payer: Medicare Other | Admitting: Radiation Oncology

## 2022-04-21 ENCOUNTER — Telehealth: Payer: Self-pay | Admitting: *Deleted

## 2022-04-21 NOTE — Telephone Encounter (Signed)
Called patient to inform of New HDR [facility], lvm for a return call 

## 2022-04-23 ENCOUNTER — Telehealth: Payer: Self-pay | Admitting: *Deleted

## 2022-04-23 NOTE — Telephone Encounter (Signed)
CALLED PATIENT TO REMIND OF NEW [facility] APPT. FOR 04-26-22, SPOKE WITH PATIENT AND SHE IS AWARE OF THESE APPTS.

## 2022-04-25 NOTE — Progress Notes (Signed)
  Radiation Oncology         [phone] ________________________________  Name: Alyssa Taylor MRN: [id number]  Date: 04/26/2022  DOB: 1941/04/16  Vaginal Brachytherapy Procedure Note  CC: Holland Commons, FNP [name], MD  No diagnosis found.  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 A diagnosis of Endometrial cancer [facility]) was also pertinent to this visit.   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 ***  No significant interval history since the patient was seen for her initial consultation on 04/07/22.   ALLERGIES: has no active allergies.  Meds: Current Outpatient Medications  Medication Sig Dispense Refill   carboxymethylcellul-glycerin (REFRESH OPTIVE) 0.5-0.9 % ophthalmic solution Place 1-2 drops into both eyes 3 (three) times daily as needed (dry/irritated eyes.).     cholecalciferol (VITAMIN D) 25 MCG (1000 UNIT) tablet Take 1,000 Units by mouth daily after lunch.     lisinopril (ZESTRIL) 10 MG tablet Take 10 mg by mouth every evening.     senna-docusate (SENOKOT-S) 8.6-50 MG tablet Take 2 tablets by mouth at bedtime. For AFTER surgery, do not take if having diarrhea 30 tablet 0   No current facility-administered medications for this encounter.    Physical Findings: The patient is in no acute distress. Patient is alert and oriented.  vitals were not taken for this visit.   No palpable cervical, supraclavicular or axillary lymphoadenopathy. The heart has a regular rate and rhythm. The lungs are clear to auscultation. Abdomen soft and non-tender.  On pelvic examination the external genitalia were unremarkable. A speculum exam was performed. Vaginal cuff intact, no mucosal lesions. On bimanual exam there were no pelvic masses appreciated.  Lab Findings: Lab Results  Component Value Date    WBC 8.2 03/10/2022   HGB 13.0 03/10/2022   HCT 40.2 03/10/2022   MCV 92.4 03/10/2022   PLT 295 03/10/2022    Radiographic Findings: No results found.  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 The patient will be treated with a *** cm diameter cylinder with a treatment length of *** cm. This distended the vaginal vault without undue discomfort. The patient tolerated the procedure well.  The patient was successfully fitted for a vaginal cylinder. The patient is appropriate to begin vaginal brachytherapy.   Plan: The patient will proceed with CT simulation and vaginal brachytherapy today.    _______________________________   Blair Promise, PhD, MD  This document serves as a record of services personally performed by Gery Pray, MD. It was created on his behalf by Roney Mans, a trained medical scribe. The creation of this record is based on the scribe's personal observations and the provider's statements to them. This document has been checked and approved by the attending provider.

## 2022-04-26 ENCOUNTER — Ambulatory Visit
Admission: RE | Admit: 2022-04-26 | Discharge: 2022-04-26 | Disposition: A | Discharge status: Home / Self Care | Payer: Medicare Other | Source: Ambulatory Visit | Attending: Radiation Oncology | Admitting: Radiation Oncology

## 2022-04-26 ENCOUNTER — Encounter: Payer: Self-pay | Admitting: Radiation Oncology

## 2022-04-26 ENCOUNTER — Ambulatory Visit: Payer: Medicare Other | Admitting: Radiation Oncology

## 2022-04-26 ENCOUNTER — Other Ambulatory Visit: Payer: Self-pay

## 2022-04-26 DIAGNOSIS — C541 Malignant neoplasm of endometrium: Secondary | ICD-10-CM | POA: Diagnosis present

## 2022-04-26 DIAGNOSIS — Z79899 Other long term (current) drug therapy: Secondary | ICD-10-CM | POA: Diagnosis not present

## 2022-04-26 NOTE — Progress Notes (Signed)
Alyssa Taylor is here today for new vaginal cylinder.    Does the patient complain of any of the following:  Pain: No  Abdominal bloating: No Diarrhea/Constipation: No Nausea/Vomiting: No Vaginal Discharge: No Blood in Urine or Stool: No Urinary Issues (dysuria/incomplete emptying/ incontinence/ increased frequency/urgency): No  Additional comments if applicable:    BP (!) 707/61 (BP Location: Right Wrist, Patient Position: Sitting, Cuff Size: Normal)   Pulse 66   Temp 97.8 F (36.6 C)   Resp 20   Ht '5\' 2"'$  (1.575 m)   Wt 195 lb 6.4 oz (88.6 kg)   LMP  (LMP Unknown)   SpO2 100%   BMI 35.74 kg/m

## 2022-04-27 ENCOUNTER — Telehealth: Payer: Self-pay | Admitting: *Deleted

## 2022-04-27 ENCOUNTER — Telehealth: Payer: Self-pay

## 2022-04-27 NOTE — Telephone Encounter (Signed)
Enid Derry from (RAD ONC) called office requesting a 3 month follow up for patient.   Patient is scheduled for a follow up on 07/20/22  at 2:30pm with Dr. Berline Lopes.   Enid Derry to notify pt of appointment date and time.

## 2022-04-27 NOTE — Telephone Encounter (Signed)
CALLED PATIENT TO INFORM OF FU APPT. WITH DR. Berline Lopes ON 07-20-22- ARRIVAL TIME- 2 PM @ [place], SPOKE WITH PATIENT AND SHE IS AWARE OF THIS APPT.

## 2022-05-06 ENCOUNTER — Ambulatory Visit: Payer: Medicare Other | Admitting: Radiation Oncology

## 2022-05-11 ENCOUNTER — Ambulatory Visit: Payer: Medicare Other | Admitting: Radiation Oncology

## 2022-05-11 ENCOUNTER — Other Ambulatory Visit: Payer: Self-pay | Admitting: Gynecologic Oncology

## 2022-05-11 DIAGNOSIS — C541 Malignant neoplasm of endometrium: Secondary | ICD-10-CM

## 2022-05-20 ENCOUNTER — Ambulatory Visit: Payer: Medicare Other | Admitting: Radiation Oncology

## 2022-05-24 ENCOUNTER — Ambulatory Visit: Payer: Medicare Other | Admitting: Radiation Oncology

## 2022-07-19 ENCOUNTER — Encounter: Payer: Self-pay | Admitting: Gynecologic Oncology

## 2022-07-20 ENCOUNTER — Inpatient Hospital Stay: Payer: Medicare Other | Attending: Gynecologic Oncology | Admitting: Gynecologic Oncology

## 2022-07-20 ENCOUNTER — Encounter: Payer: Self-pay | Admitting: Gynecologic Oncology

## 2022-07-20 ENCOUNTER — Other Ambulatory Visit: Payer: Self-pay

## 2022-07-20 VITALS — BP 162/78 | HR 68 | Temp 98.4°F | Resp 17 | Ht 62.0 in | Wt 200.4 lb

## 2022-07-20 DIAGNOSIS — Z08 Encounter for follow-up examination after completed treatment for malignant neoplasm: Secondary | ICD-10-CM | POA: Insufficient documentation

## 2022-07-20 DIAGNOSIS — C541 Malignant neoplasm of endometrium: Secondary | ICD-10-CM

## 2022-07-20 DIAGNOSIS — Z8542 Personal history of malignant neoplasm of other parts of uterus: Secondary | ICD-10-CM

## 2022-07-20 DIAGNOSIS — Z90722 Acquired absence of ovaries, bilateral: Secondary | ICD-10-CM | POA: Insufficient documentation

## 2022-07-20 DIAGNOSIS — Z90710 Acquired absence of both cervix and uterus: Secondary | ICD-10-CM | POA: Diagnosis not present

## 2022-07-20 NOTE — Progress Notes (Signed)
Gynecologic Oncology Return Clinic Visit  07/20/22  Reason for Visit: follow-up in the setting of uterine cancer  Treatment History: Oncology History Overview Note  P53 wildtype MMR intact   Endometrial cancer [facility])   Initial Diagnosis   Endometrial cancer ([place])    Initial Biopsy   EMB: CAH, at least   12/16/2021 Surgery   Hysteroscopy, D&C. Pathology revealed grade 1 endometrioid adenocarcinoma.   03/09/2022 Surgery   Robotic-assisted laparoscopic total hysterectomy with bilateral salpingo-oophorectomy, SLN biopsy bilaterally and excision of additional prominent lymph node, lysis of adhesions for 45 minutes, cystoscopy  Findings: On EUA, mobile 10 cm uterus.  On intra-abdominal entry, normal upper abdominal survey including diaphragm, liver edge, and stomach.  Normal-appearing small bowel and omentum.  Sigmoid colon with significant adhesions to the left pelvic sidewall and posterior uterus/cervix, suspected to be related to prior incidence of diverticulitis given diverticular disease noted.  Left ovary somewhat enlarged for postmenopausal status and again adherent to the colon.  Right adnexa atrophic and mildly abnormal fallopian tube.  Uterus approximately 10 cm and somewhat bulbous at the fundus.  Mapping successful to bilateral pelvic basins, right obturator sentinel lymph node and left external iliac sentinel lymph node.  Additional green lymph node noted at the bifurcation of the external and internal iliac arteries (resected as mildly prominent).  On cystoscopy, bladder dome intact, good efflux noted from bilateral ureteral orifices.    Pathology Results   Stage IB, grade 1 endometrioid 83% MI No LVSI ITCs in 1 of 3 SLNs     Interval History: Decided against VBT.  Denies any vaginal bleeding or discharge.  Reports normal bowel and bladder function.  Continues to have some baseline urinary incontinence, unchanged since prior to surgery.  Denies any abdominal or pelvic pain.   Has gained a little weight secondary to eating more.  Broke her right arm 2 weeks ago.  Past Medical/Surgical History: Past Medical History:  Diagnosis Date   Arthritis    right knee   Cancer ([place])    Hernia, hiatal    Hypertension    Stroke [facility])    Vertigo     Past Surgical History:  Procedure Laterality Date   DILATATION & CURETTAGE/HYSTEROSCOPY WITH MYOSURE N/A 12/16/2021   Procedure: DILATATION & CURETTAGE OF UTERUS;  HYSTEROSCOPY;  MYOSURE;  Surgeon: [name], MD;  Location: [facility];  Service: Gynecology;  Laterality: N/A;   DILATION AND CURETTAGE OF UTERUS N/A 1975   after a pregnancy   LYMPH NODE DISSECTION N/A 03/09/2022   Procedure: LYMPH NODE DISSECTION;  Surgeon: [name], MD;  [facility];  Service: Gynecology;  Laterality: N/A;   ROBOTIC ASSISTED TOTAL HYSTERECTOMY WITH BILATERAL SALPINGO OOPHERECTOMY Bilateral 03/09/2022   Procedure: XI ROBOTIC ASSISTED TOTAL HYSTERECTOMY WITH BILATERAL SALPINGO OOPHORECTOMY, CYSTOSCOPY;  Surgeon: [name], MD;  Location[facility];  Service: Gynecology;  Laterality: Bilateral;   SENTINEL NODE BIOPSY N/A 03/09/2022   Procedure: SENTINEL NODE BIOPSY;  Surgeon: [name], MD;  [facility];  Service: Gynecology;  Laterality: N/A;   TONSILLECTOMY N/A 1988   TUBAL LIGATION  1984   WISDOM TOOTH EXTRACTION      Family History  Problem Relation Age of Onset   Cancer Maternal Grandmother        colon cancer   Colon cancer Maternal Grandmother    Breast cancer Neg Hx    Ovarian cancer Neg Hx    Endometrial cancer Neg Hx  Pancreatic cancer Neg Hx    Prostate cancer Neg Hx     Social History   Socioeconomic History   Marital status: Divorced    Spouse name: Not on file   Number of children: Not on file   Years of education: Not on file   Highest education level: Not on file  Occupational History   Not on file  Tobacco Use   Smoking status: Never   Smokeless  tobacco: Never  Vaping Use   Vaping Use: Never used  Substance and Sexual Activity   Alcohol use: Not Currently   Drug use: Never   Sexual activity: Yes  Other Topics Concern   Not on file  Social History Narrative   Not on file   Social Determinants of Health   Financial Resource Strain: Not on file  Food Insecurity: Not on file  Transportation Needs: No Transportation Needs (04/07/2022)   PRAPARE - Hydrologist (Medical): No    Lack of Transportation (Non-Medical): No  Physical Activity: Not on file  Stress: Not on file  Social Connections: Not on file    Current Medications:  Current Outpatient Medications:    carboxymethylcellul-glycerin (REFRESH OPTIVE) 0.5-0.9 % ophthalmic solution, Place 1-2 drops into both eyes 3 (three) times daily as needed (dry/irritated eyes.)., Disp: , Rfl:    cholecalciferol (VITAMIN D) 25 MCG (1000 UNIT) tablet, Take 1,000 Units by mouth daily after lunch., Disp: , Rfl:    lisinopril (ZESTRIL) 10 MG tablet, Take 10 mg by mouth every evening., Disp: , Rfl:   Review of Systems: + Weight gain, recent cough secondary to being sick, right knee pain, urinary incontinence. Denies appetite changes, fevers, chills, fatigue, Denies hearing loss, neck lumps or masses, mouth sores, ringing in ears or voice changes. Denies wheezing.  Denies shortness of breath. Denies chest pain or palpitations. Denies leg swelling. Denies abdominal distention, pain, blood in stools, constipation, diarrhea, nausea, vomiting, or early satiety. Denies pain with intercourse, dysuria, frequency, hematuria. Denies hot flashes, pelvic pain, vaginal bleeding or vaginal discharge.   Denies back pain or muscle pain/cramps. Denies itching, rash, or wounds. Denies dizziness, headaches, numbness or seizures. Denies swollen lymph nodes or glands, denies easy bruising or bleeding. Denies anxiety, depression, confusion, or decreased  concentration.  Physical Exam: BP (!) 162/78 (BP Location: Left Arm, Patient Position: Sitting)   Pulse 68   Temp 98.4 F (36.9 C) (Oral)   Resp 17   Ht 5' 2" (1.575 m)   Wt 200 lb 7 oz (90.9 kg)   LMP  (LMP Unknown)   SpO2 97%   BMI 36.66 kg/m  General: Alert, oriented, no acute distress. HEENT: Normocephalic, atraumatic, sclera anicteric. Chest: Clear to auscultation bilaterally. No wheezes or rhonchi. Cardiovascular: Regular rate and rhythm, no murmurs. Abdomen: Obese, soft, nontender.  Normoactive bowel sounds.  No masses or hepatosplenomegaly appreciated.  Well-healed incisions. Extremities: Grossly normal range of motion.  Warm, well perfused.  No edema bilaterally. Skin: No rashes or lesions noted. Lymphatics: No cervical, supraclavicular, or inguinal adenopathy. GU: Normal appearing external genitalia without erythema, excoriation, or lesions.  Speculum exam reveals cuff intact, some physiologic discharge.  No bleeding, no lesions noted at the vaginal cuff.  Bimanual exam reveals intact without nodularity or masses.  Rectovaginal exam confirms findings.  Laboratory & Radiologic Studies: New  Assessment & Plan: Alyssa Taylor is a 81 y.o. woman with Stage Ibi+ grade 1 endometrioid adenocarcinoma who presents for follow-up after surgery. MMR intact.  MSS.   Patient is doing well.  She is NED on exam today.  Despite recommendation for adjuvant radiation in the setting of her high-intermediate risk factors, the patient ultimately opted not to proceed with recommended radiation.  We discussed plan for surveillance.  Per NCCN surveillance recommendations, we will have a visit with an exam every 3 months for the next 2-3 years.  We reviewed signs and symptoms that would be concerning for cancer recurrence, and I stressed the importance of calling if she develops any of the symptoms between visits.   22 minutes of total time was spent for this patient encounter, including preparation,  face-to-face counseling with the patient and coordination of care, and documentation of the encounter.  Jeral Pinch, MD  Division of Gynecologic Oncology  Department of Obstetrics and Gynecology  [facility]

## 2022-07-20 NOTE — Patient Instructions (Signed)
It was good to see you today.  I do not see or feel any evidence of cancer recurrence on your exam.  We will continue with visits every 3 months.  If you develop any new and concerning symptoms (like the ones we talked about today including vaginal bleeding, pelvic pain, or unintentional weight loss), call to see me sooner than your next visit.

## 2022-09-29 ENCOUNTER — Other Ambulatory Visit: Payer: Self-pay | Admitting: Gynecologic Oncology

## 2022-09-29 DIAGNOSIS — C541 Malignant neoplasm of endometrium: Secondary | ICD-10-CM

## 2022-10-04 ENCOUNTER — Telehealth: Payer: Self-pay | Admitting: *Deleted

## 2022-10-04 NOTE — Telephone Encounter (Signed)
Patient called and scheduled a follow up appt with Dr Berline Lopes in February

## 2022-11-12 ENCOUNTER — Inpatient Hospital Stay: Payer: Medicare Other | Admitting: Gynecologic Oncology

## 2022-12-04 ENCOUNTER — Other Ambulatory Visit: Payer: Self-pay | Admitting: Gynecologic Oncology

## 2022-12-04 DIAGNOSIS — C541 Malignant neoplasm of endometrium: Secondary | ICD-10-CM

## 2022-12-12 IMAGING — US US PELVIS COMPLETE WITH TRANSVAGINAL
1 series · 13 of 25 positions shown · non-contrast
Comparison: None

CLINICAL DATA: 81-year-old with at least a complex atypical
endometrial hyperplasia. Recent endometrial biopsy demonstrated
well-differentiated endometrioid adenocarcinoma.

EXAM:
TRANSABDOMINAL AND TRANSVAGINAL ULTRASOUND OF PELVIS
TECHNIQUE: Both transabdominal and transvaginal ultrasound examinations of the
pelvis were performed. Transabdominal technique was performed for
global imaging of the pelvis including uterus, ovaries, adnexal
regions, and pelvic cul-de-sac. It was necessary to proceed with
endovaginal exam following the transabdominal exam to visualize the
endometrium and ovaries.

[Series 1: us pelvis complete with transvaginal · 13 of 72 slices shown]
[im 1/72]
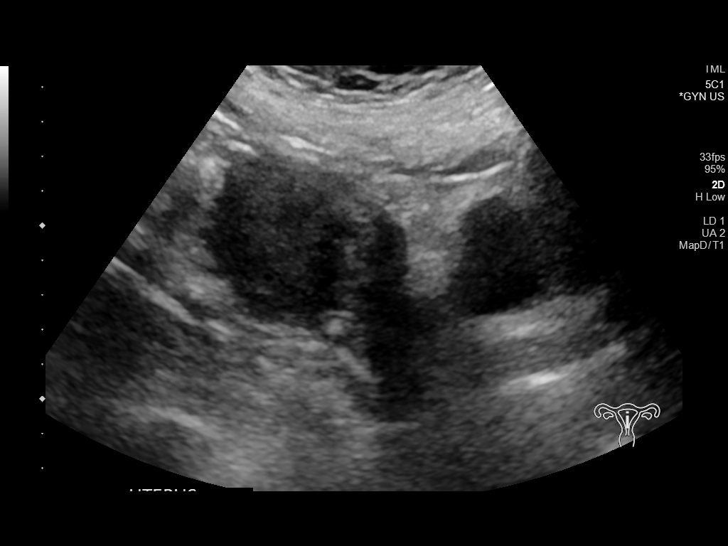
[im 6/72]
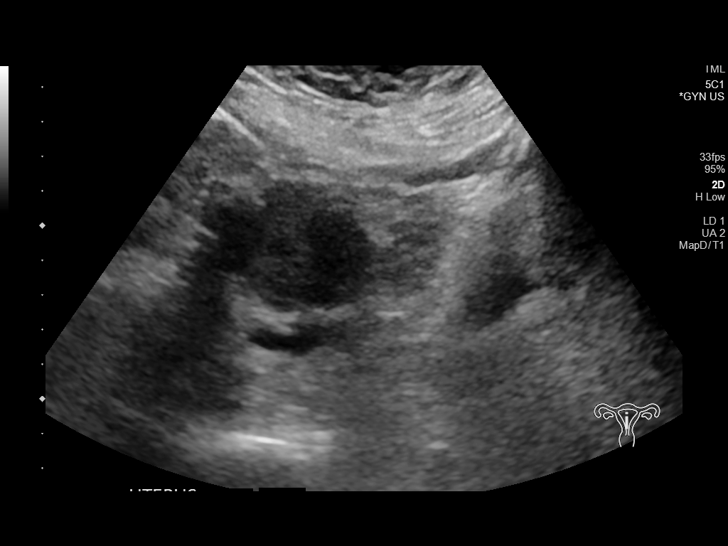
[im 12/72]
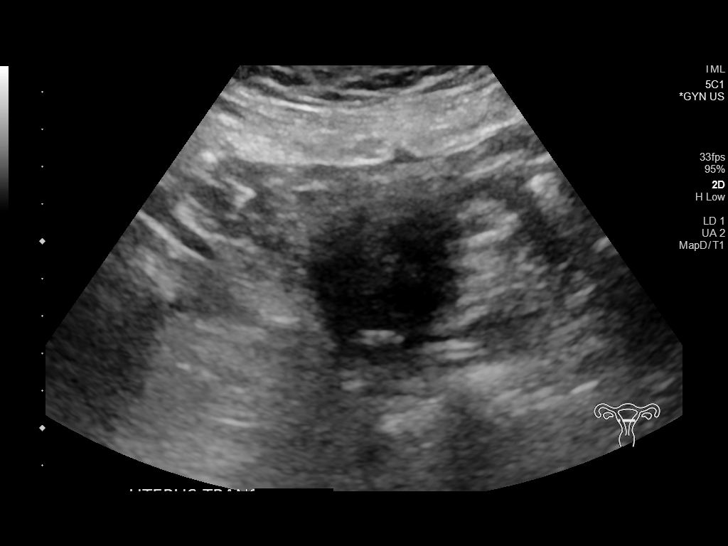
[im 18/72]
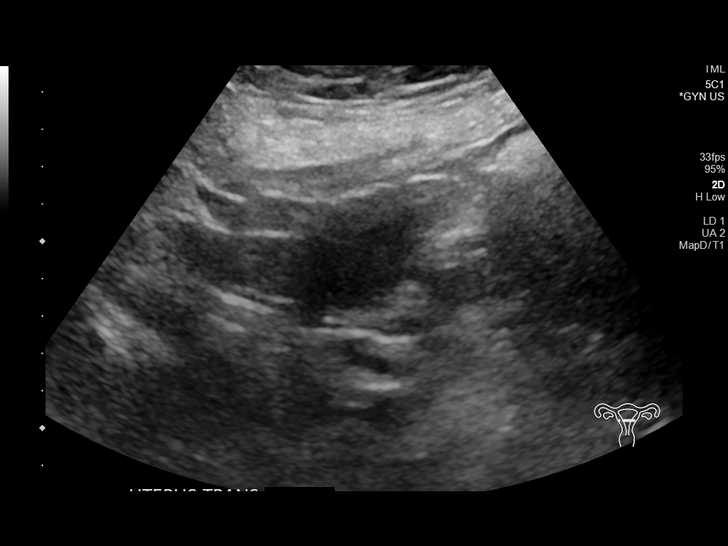
[im 24/72]
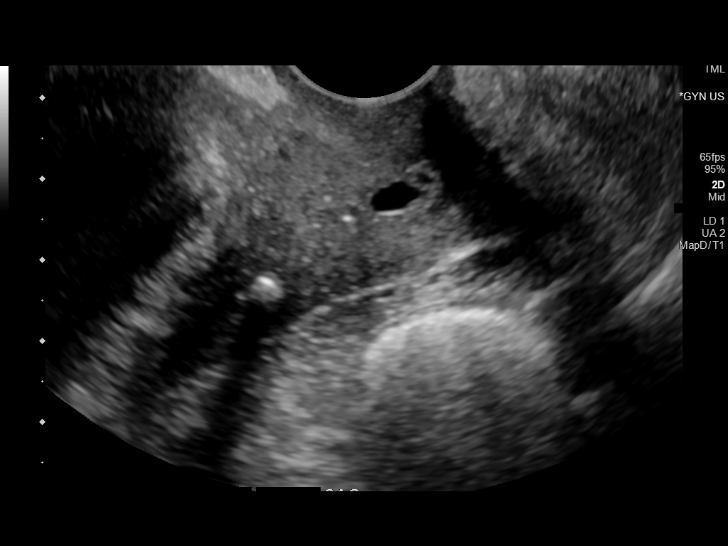
[im 30/72]
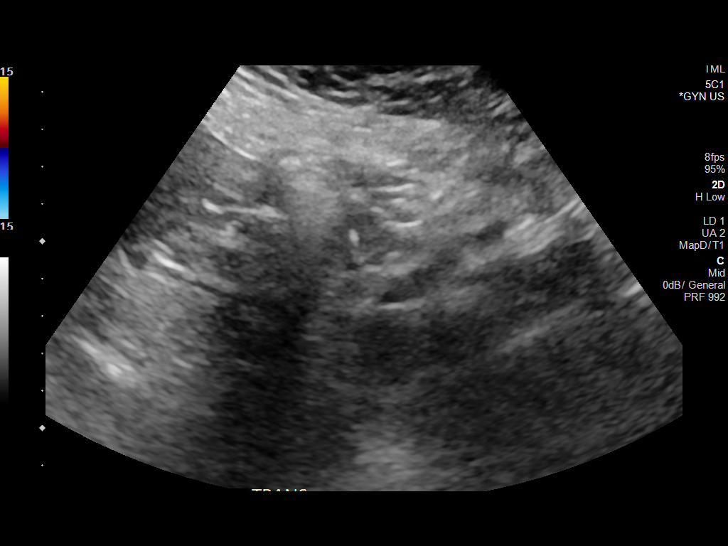
[im 36/72]
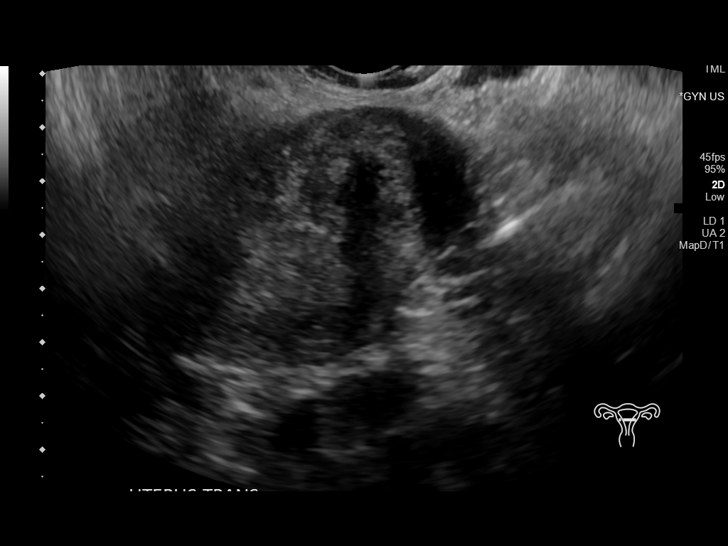
[im 42/72]
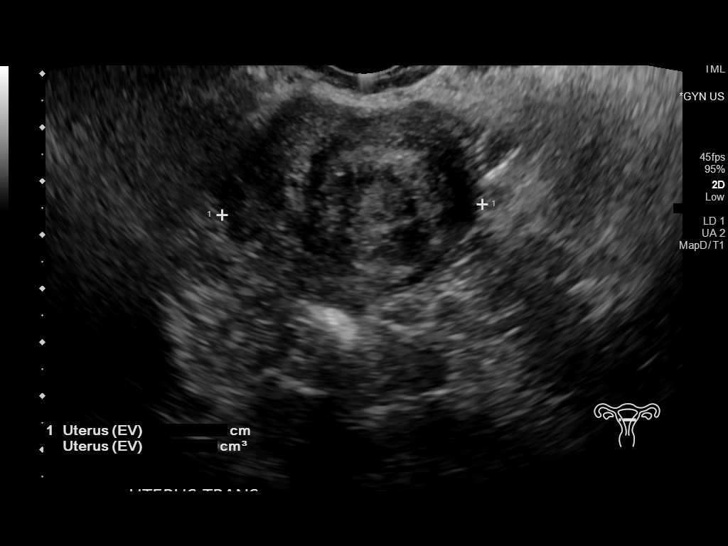
[im 48/72]
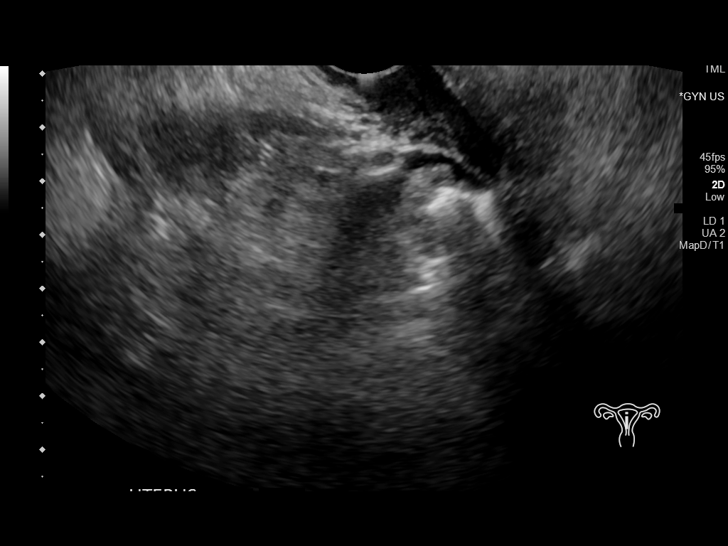
[im 54/72]
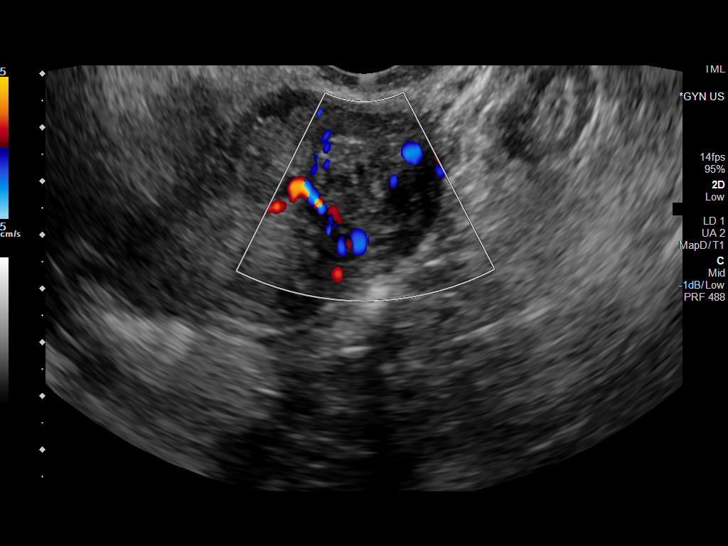
[im 60/72]
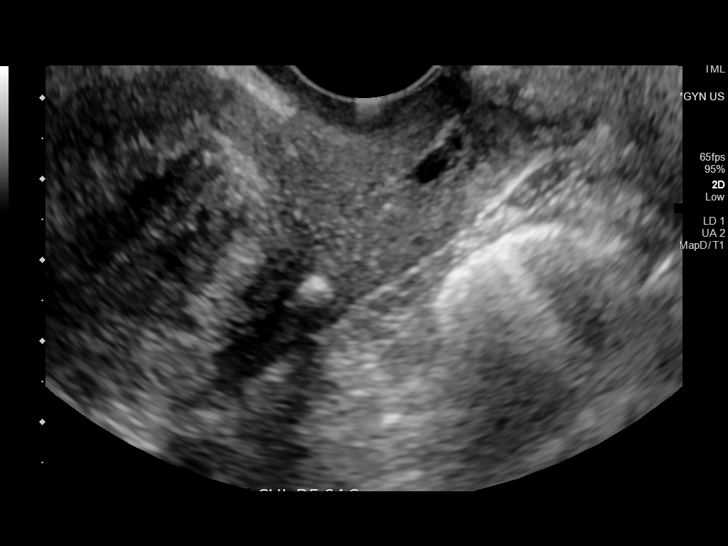
[im 66/72]
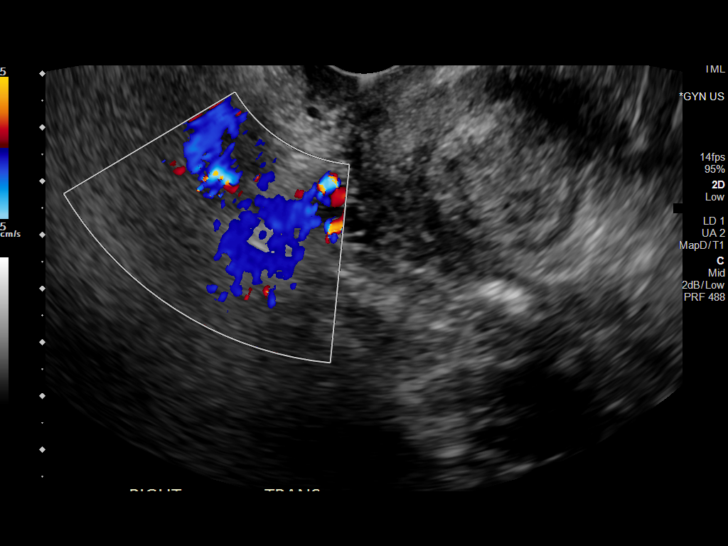
[im 72/72]
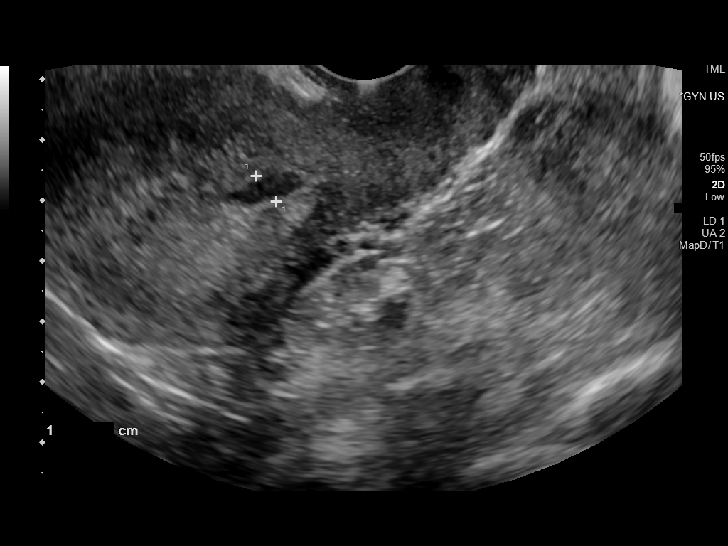

[13 of 25 positions shown; findings below may reference images not displayed]

FINDINGS: Uterus

Measurements: 7.9 x 4.7 x 4.9 cm = volume: 94 mL. There is focal
calcification in the lower uterine segment with posterior acoustic
shadowing that measures 0.5 cm. Heterogeneous soft tissue mass in
the lower central aspect of the uterus that probably involves the
endometrium. This structure measures 2.8 x 1.6 x 2.6 cm.

Endometrium

Thickness: 2.5 cm. Small amount of fluid in the cervix and
endometrial cavity.

Right ovary

Not visualized

Left ovary

Measurements: 1.3 x 1.3 x 1.4 cm = volume: 1.2 mL. Normal
appearance/no adnexal mass.

Other findings

No abnormal free fluid.
IMPRESSION: 1. Heterogeneous mass in the central aspect of the lower uterus.
This lesion measures up to 2.8 cm. There is diffuse thickening
throughout the endometrium. Findings are compatible with history of
adenocarcinoma.
2. Normal appearance of left ovary.  Right ovary not visualized.

## 2022-12-29 ENCOUNTER — Encounter: Payer: Self-pay | Admitting: Gynecologic Oncology

## 2022-12-31 ENCOUNTER — Encounter: Payer: Self-pay | Admitting: Gynecologic Oncology

## 2022-12-31 ENCOUNTER — Other Ambulatory Visit: Payer: Self-pay

## 2022-12-31 ENCOUNTER — Inpatient Hospital Stay: Payer: Medicare HMO | Attending: Gynecologic Oncology | Admitting: Gynecologic Oncology

## 2022-12-31 VITALS — BP 154/82 | HR 65 | Temp 97.5°F | Wt 203.2 lb

## 2022-12-31 DIAGNOSIS — Z9079 Acquired absence of other genital organ(s): Secondary | ICD-10-CM | POA: Insufficient documentation

## 2022-12-31 DIAGNOSIS — Z90722 Acquired absence of ovaries, bilateral: Secondary | ICD-10-CM | POA: Diagnosis not present

## 2022-12-31 DIAGNOSIS — Z8542 Personal history of malignant neoplasm of other parts of uterus: Secondary | ICD-10-CM | POA: Diagnosis not present

## 2022-12-31 DIAGNOSIS — C541 Malignant neoplasm of endometrium: Secondary | ICD-10-CM

## 2022-12-31 DIAGNOSIS — Z90710 Acquired absence of both cervix and uterus: Secondary | ICD-10-CM | POA: Insufficient documentation

## 2022-12-31 NOTE — Progress Notes (Signed)
Gynecologic Oncology Return Clinic Visit  12/31/22  Reason for Visit: surveillance  Treatment History: Oncology History Overview Note  P53 wildtype MMR intact   Endometrial cancer   Initial Diagnosis   Endometrial cancer ([facility])    Initial Biopsy   EMB: CAH, at least   12/16/2021 Surgery   Hysteroscopy, D&C. Pathology revealed grade 1 endometrioid adenocarcinoma.   03/09/2022 Surgery   Robotic-assisted laparoscopic total hysterectomy with bilateral salpingo-oophorectomy, SLN biopsy bilaterally and excision of additional prominent lymph node, lysis of adhesions for 45 minutes, cystoscopy  Findings: On EUA, mobile 10 cm uterus.  On intra-abdominal entry, normal upper abdominal survey including diaphragm, liver edge, and stomach.  Normal-appearing small bowel and omentum.  Sigmoid colon with significant adhesions to the left pelvic sidewall and posterior uterus/cervix, suspected to be related to prior incidence of diverticulitis given diverticular disease noted.  Left ovary somewhat enlarged for postmenopausal status and again adherent to the colon.  Right adnexa atrophic and mildly abnormal fallopian tube.  Uterus approximately 10 cm and somewhat bulbous at the fundus.  Mapping successful to bilateral pelvic basins, right obturator sentinel lymph node and left external iliac sentinel lymph node.  Additional green lymph node noted at the bifurcation of the external and internal iliac arteries (resected as mildly prominent).  On cystoscopy, bladder dome intact, good efflux noted from bilateral ureteral orifices.    Pathology Results   Stage IB, grade 1 endometrioid 83% MI No LVSI ITCs in 1 of 3 SLNs     Interval History: Doing well.  Denies any vaginal bleeding or discharge.  Denies abdominal or pelvic pain.  Reports baseline bowel bladder function.  Past Medical/Surgical History: Past Medical History:  Diagnosis Date   Arthritis    right knee   Cancer    Hernia, hiatal     Hypertension    Stroke    Vertigo     Past Surgical History:  Procedure Laterality Date   DILATATION & CURETTAGE/HYSTEROSCOPY WITH MYOSURE N/A 12/16/2021   Procedure: DILATATION & CURETTAGE OF UTERUS;  HYSTEROSCOPY;  MYOSURE;  Surgeon: Carver Filaucker, Vijay Durflinger R, MD;  [facility];  Service: Gynecology;  Laterality: N/A;   DILATION AND CURETTAGE OF UTERUS N/A 1975   after a pregnancy   LYMPH NODE DISSECTION N/A 03/09/2022   Procedure: LYMPH NODE DISSECTION;  Surgeon: Carver Filaucker, Bryana Froemming R, MD;  Location[facility];  Service: Gynecology;  Laterality: N/A;   ROBOTIC ASSISTED TOTAL HYSTERECTOMY WITH BILATERAL SALPINGO OOPHERECTOMY Bilateral 03/09/2022   Procedure: XI ROBOTIC ASSISTED TOTAL HYSTERECTOMY WITH BILATERAL SALPINGO OOPHORECTOMY, CYSTOSCOPY;  Surgeon: Carver Filaucker, Aranda Bihm R, MD;  Location[facility];  Service: Gynecology;  Laterality: Bilateral;   SENTINEL NODE BIOPSY N/A 03/09/2022   Procedure: SENTINEL NODE BIOPSY;  Surgeon: Carver Filaucker, Lynleigh Kovack R, MD;  [facility];  Service: Gynecology;  Laterality: N/A;   TONSILLECTOMY N/A 1988   TUBAL LIGATION  1984   WISDOM TOOTH EXTRACTION      Family History  Problem Relation Age of Onset   Cancer Maternal Grandmother        colon cancer   Colon cancer Maternal Grandmother    Breast cancer Neg Hx    Ovarian cancer Neg Hx    Endometrial cancer Neg Hx    Pancreatic cancer Neg Hx    Prostate cancer Neg Hx     Social History   Socioeconomic History   Marital status: Divorced    Spouse name: Not on file   Number of children: Not on file  Years of education: Not on file   Highest education level: Not on file  Occupational History   Not on file  Tobacco Use   Smoking status: Never   Smokeless tobacco: Never  Vaping Use   Vaping Use: Never used  Substance and Sexual Activity   Alcohol use: Not Currently   Drug use: Never   Sexual activity: Yes  Other Topics Concern   Not on file  Social History Narrative   Not on  file   Social Determinants of Health   Financial Resource Strain: Not on file  Food Insecurity: Not on file  Transportation Needs: No Transportation Needs (04/07/2022)   PRAPARE - Administrator, Civil ServiceTransportation    Lack of Transportation (Medical): No    Lack of Transportation (Non-Medical): No  Physical Activity: Not on file  Stress: Not on file  Social Connections: Not on file    Current Medications:  Current Outpatient Medications:    carboxymethylcellul-glycerin (REFRESH OPTIVE) 0.5-0.9 % ophthalmic solution, Place 1-2 drops into both eyes 3 (three) times daily as needed (dry/irritated eyes.)., Disp: , Rfl:    cholecalciferol (VITAMIN D) 25 MCG (1000 UNIT) tablet, Take 1,000 Units by mouth daily after lunch., Disp: , Rfl:    lisinopril (ZESTRIL) 10 MG tablet, Take 10 mg by mouth every evening., Disp: , Rfl:   Review of Systems: Denies appetite changes, fevers, chills, fatigue, unexplained weight changes. Denies hearing loss, neck lumps or masses, mouth sores, ringing in ears or voice changes. Denies cough or wheezing.  Denies shortness of breath. Denies chest pain or palpitations. Denies leg swelling. Denies abdominal distention, pain, blood in stools, constipation, diarrhea, nausea, vomiting, or early satiety. Denies pain with intercourse, dysuria, frequency, hematuria or incontinence. Denies hot flashes, pelvic pain, vaginal bleeding or vaginal discharge.   Denies joint pain, back pain or muscle pain/cramps. Denies itching, rash, or wounds. Denies dizziness, headaches, numbness or seizures. Denies swollen lymph nodes or glands, denies easy bruising or bleeding. Denies anxiety, depression, confusion, or decreased concentration.  Physical Exam: BP (!) 154/82 (BP Location: Left Arm, Patient Position: Sitting) Comment: BP done by Selena BattenKim CMA  Pulse 65   Temp (!) 97.5 F (36.4 C) (Oral)   Wt 203 lb 3.2 oz (92.2 kg)   LMP  (LMP Unknown)   SpO2 100%   BMI 37.17 kg/m  General: Alert, oriented,  no acute distress. HEENT: Normocephalic, atraumatic, sclera anicteric. Chest: Clear to auscultation bilaterally. No wheezes or rhonchi. Cardiovascular: Regular rate and rhythm, no murmurs. Abdomen: Obese, soft, nontender.  Normoactive bowel sounds.  No masses or hepatosplenomegaly appreciated.  Well-healed incisions. Extremities: Grossly normal range of motion.  Warm, well perfused.  No edema bilaterally. Skin: No rashes or lesions noted. Lymphatics: No cervical, supraclavicular, or inguinal adenopathy. GU: Normal appearing external genitalia without erythema, excoriation, or lesions.  Speculum exam reveals cuff intact, some physiologic discharge.  No bleeding, no lesions noted at the vaginal cuff.  Bimanual exam reveals intact without nodularity or masses.  Rectovaginal exam confirms findings.  Laboratory & Radiologic Studies: None new  Assessment & Plan: Darrold JunkerSandra Taylor is a 82 y.o. woman with Stage Ibi+ grade 1 endometrioid adenocarcinoma who presents for surveillance. MMR intact. MSS. P53 wildtype.   Patient is doing well.  She is NED on exam today.   Despite recommendation for adjuvant radiation in the setting of her high-intermediate risk factors, the patient ultimately opted not to proceed with recommended radiation.  We discussed plan for surveillance.  Per NCCN surveillance recommendations, we will have  a visit with an exam every 3 months for the next 2-3 years.  We reviewed signs and symptoms that would be concerning for cancer recurrence, and I stressed the importance of calling if she develops any of the symptoms between visits.  22 minutes of total time was spent for this patient encounter, including preparation, face-to-face counseling with the patient and coordination of care, and documentation of the encounter.  Eugene Garnet, MD  Division of Gynecologic Oncology  Department of Obstetrics and Gynecology  [facility]

## 2022-12-31 NOTE — Patient Instructions (Signed)
It was good to see you today.  I do not see or feel any evidence of cancer recurrence on your exam.  I will see you for follow-up in 3 months.  As always, if you develop any new and concerning symptoms before your next visit, please call to see me sooner.  

## 2023-03-28 ENCOUNTER — Encounter: Payer: Self-pay | Admitting: Gynecologic Oncology

## 2023-04-01 ENCOUNTER — Inpatient Hospital Stay: Payer: Medicaid - Out of State | Attending: Gynecologic Oncology | Admitting: Gynecologic Oncology

## 2023-04-01 DIAGNOSIS — C541 Malignant neoplasm of endometrium: Secondary | ICD-10-CM

## 2023-04-01 NOTE — Progress Notes (Signed)
Patient was a no show to appt today.

## 2023-04-08 ENCOUNTER — Telehealth: Payer: Self-pay | Admitting: *Deleted

## 2023-04-08 NOTE — Telephone Encounter (Signed)
Spoke with Alyssa Taylor who called the office to reschedule her appointment with Dr. Pricilla Holm.  Pt was rescheduled for Thursday, August 15 th at 1600. Pt agreed to date and time.

## 2023-05-12 ENCOUNTER — Inpatient Hospital Stay: Payer: Medicare HMO | Attending: Gynecologic Oncology | Admitting: Gynecologic Oncology

## 2023-05-12 DIAGNOSIS — C541 Malignant neoplasm of endometrium: Secondary | ICD-10-CM

## 2023-05-12 NOTE — Progress Notes (Unsigned)
The patient was a no show to her scheduled visit today

## 2023-05-18 ENCOUNTER — Telehealth: Payer: Self-pay

## 2023-05-18 NOTE — Telephone Encounter (Signed)
Pt is scheduled for 8/29 @ 1:00 with Warner Mccreedy APP. She wrote it down and read back to me while on the phone.

## 2023-05-18 NOTE — Telephone Encounter (Signed)
Alyssa Taylor called office stating " can you tell me when my next appointment it?". Pt notified of missed appointment scheduled for 8/15. She also missed appointment in July.   In reviewing Dr. Winferd Humphrey notes pt to be seen every 3 months for surveillance. Next available appointment is 10/10. Will ask Dr. Pricilla Holm about this opening and wait on advice if ok to schedule that far out.

## 2023-05-18 NOTE — Telephone Encounter (Signed)
She needs to be seen sooner than that. But please stress the importance of writing her appts down and calling if she needs to miss. Ok to schedule with Melissa on a day that I am there

## 2023-05-26 ENCOUNTER — Inpatient Hospital Stay: Payer: Medicare HMO | Admitting: Gynecologic Oncology

## 2023-05-26 DIAGNOSIS — C541 Malignant neoplasm of endometrium: Secondary | ICD-10-CM

## 2023-05-31 ENCOUNTER — Telehealth: Payer: Self-pay

## 2023-05-31 NOTE — Telephone Encounter (Addendum)
Alyssa Taylor called the office asking when her appointment is. I notified her she missed her appointment with Warner Mccreedy NP, on Thursday 8/29 @ 1:30. Notified her I left several messages on her voicemail leading up to that appointment. She states she has not checked her messages.  Alyssa Taylor states she had it written down for today at 9/3.   Per Warner Mccreedy NP, ok to reschedule for Tuesday 9/17 @ 2:00. Alyssa Taylor agrees to date/time. Verbal read back from patient she is aware I will personally call her a few days before her appointment.   06/14/23 note: Unable to reach patient on her cell phone (mailbox is full) home number is no longer in service. Message left on sisters phone to remind pt of her appointment today at 2:00.

## 2023-06-14 ENCOUNTER — Inpatient Hospital Stay: Payer: Medicare HMO | Attending: Gynecologic Oncology | Admitting: Gynecologic Oncology

## 2023-06-14 VITALS — BP 147/80 | HR 53 | Temp 98.0°F | Resp 18 | Ht 62.0 in | Wt 200.0 lb

## 2023-06-14 DIAGNOSIS — Z90710 Acquired absence of both cervix and uterus: Secondary | ICD-10-CM | POA: Diagnosis not present

## 2023-06-14 DIAGNOSIS — Z8542 Personal history of malignant neoplasm of other parts of uterus: Secondary | ICD-10-CM | POA: Insufficient documentation

## 2023-06-14 DIAGNOSIS — Z90722 Acquired absence of ovaries, bilateral: Secondary | ICD-10-CM | POA: Diagnosis not present

## 2023-06-14 DIAGNOSIS — C541 Malignant neoplasm of endometrium: Secondary | ICD-10-CM

## 2023-06-14 NOTE — Patient Instructions (Signed)
It was good seeing you today.  No concerning findings on today's exam. Good news!  Plan to follow up with Dr. Pricilla Holm in our office in 3 months or sooner if symptoms develop.  Symptoms to report to your health care team include vaginal bleeding, rectal bleeding, bloating, weight loss without effort, new and persistent pain, new and  persistent fatigue, new leg swelling, new masses (i.e., bumps in your neck or groin), new and persistent cough, new and persistent nausea and vomiting, change in bowel or bladder habits, and any other concerns.

## 2023-06-17 NOTE — Progress Notes (Signed)
Gynecologic Oncology Return Clinic Visit  06/14/2023  Reason for Visit: Surveillance visit for endometrial cancer   Treatment History: Oncology History Overview Note  P53 wildtype MMR intact   Endometrial cancer (HCC)   Initial Diagnosis   Endometrial cancer (HCC)    Initial Biopsy   EMB: CAH, at least   12/16/2021 Surgery   Hysteroscopy, D&C. Pathology revealed grade 1 endometrioid adenocarcinoma.   03/09/2022 Surgery   Robotic-assisted laparoscopic total hysterectomy with bilateral salpingo-oophorectomy, SLN biopsy bilaterally and excision of additional prominent lymph node, lysis of adhesions for 45 minutes, cystoscopy  Findings: On EUA, mobile 10 cm uterus.  On intra-abdominal entry, normal upper abdominal survey including diaphragm, liver edge, and stomach.  Normal-appearing small bowel and omentum.  Sigmoid colon with significant adhesions to the left pelvic sidewall and posterior uterus/cervix, suspected to be related to prior incidence of diverticulitis given diverticular disease noted.  Left ovary somewhat enlarged for postmenopausal status and again adherent to the colon.  Right adnexa atrophic and mildly abnormal fallopian tube.  Uterus approximately 10 cm and somewhat bulbous at the fundus.  Mapping successful to bilateral pelvic basins, right obturator sentinel lymph node and left external iliac sentinel lymph node.  Additional green lymph node noted at the bifurcation of the external and internal iliac arteries (resected as mildly prominent).  On cystoscopy, bladder dome intact, good efflux noted from bilateral ureteral orifices.    Pathology Results   Stage IB, grade 1 endometrioid 83% MI No LVSI ITCs in 1 of 3 SLNs     Interval History: Doing well since last seen.  Denies any vaginal bleeding or discharge.  Denies abdominal or pelvic pain. She reports right knee pain with mild swelling. She states she has fallen 5 times and has injured that knee on several occasions.  Her knee discomfort can limit her mobility. She reports intermittent constipation and urinary incontinence she notes more at nighttime. No other symptoms reported.   Past Medical/Surgical History: Past Medical History:  Diagnosis Date   Arthritis    right knee   Cancer (HCC)    Hernia, hiatal    Hypertension    Stroke [facility])    Vertigo     Past Surgical History:  Procedure Laterality Date   DILATATION & CURETTAGE/HYSTEROSCOPY WITH MYOSURE N/A 12/16/2021   Procedure: DILATATION & CURETTAGE OF UTERUS;  HYSTEROSCOPY;  MYOSURE;  Surgeon: Carver Fila, MD;  Location: [facility];  Service: Gynecology;  Laterality: N/A;   DILATION AND CURETTAGE OF UTERUS N/A 1975   after a pregnancy   LYMPH NODE DISSECTION N/A 03/09/2022   Procedure: LYMPH NODE DISSECTION;  Surgeon: Carver Fila, MD;  Location: [facility];  Service: Gynecology;  Laterality: N/A;   ROBOTIC ASSISTED TOTAL HYSTERECTOMY WITH BILATERAL SALPINGO OOPHERECTOMY Bilateral 03/09/2022   Procedure: XI ROBOTIC ASSISTED TOTAL HYSTERECTOMY WITH BILATERAL SALPINGO OOPHORECTOMY, CYSTOSCOPY;  Surgeon: Carver Fila, MD;  Location: [facility];  Service: Gynecology;  Laterality: Bilateral;   SENTINEL NODE BIOPSY N/A 03/09/2022   Procedure: SENTINEL NODE BIOPSY;  Surgeon: Carver Fila, MD;  Location: [facility];  Service: Gynecology;  Laterality: N/A;   TONSILLECTOMY N/A 1988   TUBAL LIGATION  1984   WISDOM TOOTH EXTRACTION      Family History  Problem Relation Age of Onset   Cancer Maternal Grandmother        colon cancer   Colon cancer Maternal Grandmother    Breast cancer Neg Hx    Ovarian cancer Neg Hx  Endometrial cancer Neg Hx    Pancreatic cancer Neg Hx    Prostate cancer Neg Hx     Social History   Socioeconomic History   Marital status: Divorced    Spouse name: Not on file   Number of children: Not on file   Years of education: Not on file   Highest education level: Not on file   Occupational History   Not on file  Tobacco Use   Smoking status: Never   Smokeless tobacco: Never  Vaping Use   Vaping status: Never Used  Substance and Sexual Activity   Alcohol use: Not Currently   Drug use: Never   Sexual activity: Yes  Other Topics Concern   Not on file  Social History Narrative   Not on file   Social Determinants of Health   Financial Resource Strain: Low Risk  (12/06/2020)   Received from [facility]   Overall Financial Resource Strain (CARDIA)    Difficulty of Paying Living Expenses: Not hard at all  Food Insecurity: Not on file  Transportation Needs: No Transportation Needs (04/07/2022)   PRAPARE - Administrator, Civil Service (Medical): No    Lack of Transportation (Non-Medical): No  Physical Activity: Not on file  Stress: No Stress Concern Present (12/06/2020)   Received from [facility], [facility] of Occupational Health - Occupational Stress Questionnaire    Feeling of Stress : Not at all  Social Connections: Unknown (02/09/2022)   Received from [facility], [facility]   Social Network    Social Network: Not on file    Current Medications:  Current Outpatient Medications:    carboxymethylcellul-glycerin (REFRESH OPTIVE) 0.5-0.9 % ophthalmic solution, Place 1-2 drops into both eyes 3 (three) times daily as needed (dry/irritated eyes.)., Disp: , Rfl:    cholecalciferol (VITAMIN D) 25 MCG (1000 UNIT) tablet, Take 1,000 Units by mouth daily after lunch., Disp: , Rfl:    lisinopril (ZESTRIL) 10 MG tablet, Take 10 mg by mouth every evening., Disp: , Rfl:   Review of Systems: +intermittent constipation, urinary incontinence, right knee discomfort and mild swelling. Denies appetite changes, fevers, chills, fatigue, unexplained weight changes. Denies hearing loss, neck lumps or masses, mouth sores, ringing in ears or voice changes. Denies cough or wheezing.  Denies shortness of  breath. Denies chest pain or palpitations. Denies leg swelling. Denies abdominal distention, pain, blood in stools, diarrhea, nausea, vomiting, or early satiety. Denies pain with intercourse, dysuria, frequency, hematuria. Denies hot flashes, pelvic pain, vaginal bleeding or vaginal discharge.   Denies back pain or muscle pain/cramps. Denies itching, rash, or wounds. Denies dizziness, headaches, numbness or seizures. Denies swollen lymph nodes or glands, denies easy bruising or bleeding. Denies anxiety, depression, confusion, or decreased concentration.  Physical Exam: BP (!) 147/80 Comment: Warner Mccreedy NP notified  Pulse (!) 53   Temp 98 F (36.7 C) (Oral)   Resp 18   Ht 5\' 2"  (1.575 m)   Wt 200 lb (90.7 kg)   LMP  (LMP Unknown)   SpO2 100%   BMI 36.58 kg/m  General: Alert, oriented, no acute distress. HEENT: Normocephalic, atraumatic, sclera anicteric. Chest: Clear to auscultation bilaterally. No wheezes or rhonchi. Cardiovascular: Regular rate and rhythm, no murmurs. Abdomen: Obese, soft, nontender.  Normoactive bowel sounds.  No masses or hepatosplenomegaly appreciated.  Well-healed incisions. Extremities: Grossly normal range of motion.  Warm, well perfused.  No edema bilaterally. Skin: No rashes or lesions noted. Lymphatics:  No cervical, supraclavicular, or inguinal adenopathy. GU: Normal appearing external genitalia without erythema, excoriation, or lesions.  Speculum exam reveals cuff intact.  No bleeding, no lesions noted at the vaginal cuff.  Bimanual exam reveals intact without nodularity or masses.  Rectovaginal exam confirms findings.  Laboratory & Radiologic Studies: None new  Assessment & Plan: Alyssa Taylor is a 82 y.o. woman with Stage Ibi+ grade 1 endometrioid adenocarcinoma who presents for surveillance. MMR intact. MSS. P53 wildtype.   Patient is doing well.  She is NED on exam today.   Despite recommendation for adjuvant radiation in the setting of her  high-intermediate risk factors, the patient ultimately opted not to proceed with recommended radiation.  We discussed plan for surveillance.    Per NCCN surveillance recommendations, we will have a visit with an exam every 3 months for the next 2-3 years.  We reviewed signs and symptoms that would be concerning for cancer recurrence, and I stressed the importance of calling if she develops any of the symptoms between visits. She will plan on following up with her PCP about her knee pain. Urinary incontinence is mild and manageable per pt.  20 minutes of total time was spent for this patient encounter, including preparation, face-to-face counseling with the patient and coordination of care, and documentation of the encounter.  Warner Mccreedy NP [facility] GYN Oncology

## 2023-06-29 ENCOUNTER — Other Ambulatory Visit (HOSPITAL_COMMUNITY): Payer: Self-pay | Admitting: Internal Medicine

## 2023-06-29 DIAGNOSIS — I10 Essential (primary) hypertension: Secondary | ICD-10-CM

## 2023-07-21 LAB — COLOGUARD: COLOGUARD: NEGATIVE

## 2023-07-21 LAB — EXTERNAL GENERIC LAB PROCEDURE: COLOGUARD: NEGATIVE

## 2023-07-27 ENCOUNTER — Ambulatory Visit (HOSPITAL_COMMUNITY): Admission: RE | Admit: 2023-07-27 | Payer: Medicare HMO | Source: Ambulatory Visit

## 2023-07-27 ENCOUNTER — Encounter (HOSPITAL_COMMUNITY): Payer: Self-pay

## 2023-09-14 ENCOUNTER — Encounter: Payer: Self-pay | Admitting: Gynecologic Oncology

## 2023-09-16 ENCOUNTER — Telehealth: Payer: Self-pay

## 2023-09-16 ENCOUNTER — Inpatient Hospital Stay (HOSPITAL_COMMUNITY): Admission: RE | Admit: 2023-09-16 | Payer: Medicare HMO | Source: Ambulatory Visit

## 2023-09-16 NOTE — Telephone Encounter (Signed)
I left another voicemail for patient to call back to schedule an appointment with Warner Mccreedy NP

## 2023-09-16 NOTE — Telephone Encounter (Signed)
Alyssa Taylor called office stating she needs to reschedule her upcoming appointment with Dr.Tucker on 12/26.   LVM for patient to call office to reschedule for 10/04/23 at 1:00 with Alyssa Mccreedy NP

## 2023-09-19 NOTE — Telephone Encounter (Signed)
Unable to reach patient, after several attempts, regarding her message she left on Friday 12/20 about needing to reschedule her appointment with Dr.Tucker on Thursday 12/26.   Appointment has been cancelled and once she calls back we will schedule her.

## 2023-09-22 ENCOUNTER — Inpatient Hospital Stay: Payer: Self-pay | Admitting: Gynecologic Oncology

## 2023-09-22 DIAGNOSIS — C541 Malignant neoplasm of endometrium: Secondary | ICD-10-CM

## 2023-11-28 ENCOUNTER — Encounter: Payer: Self-pay | Admitting: Obstetrics and Gynecology

## 2024-01-03 ENCOUNTER — Inpatient Hospital Stay: Attending: Obstetrics & Gynecology | Admitting: Obstetrics & Gynecology

## 2024-01-03 VITALS — BP 148/90 | HR 60 | Temp 98.5°F | Resp 20 | Wt 204.2 lb

## 2024-01-03 DIAGNOSIS — Z8542 Personal history of malignant neoplasm of other parts of uterus: Secondary | ICD-10-CM | POA: Diagnosis present

## 2024-01-03 DIAGNOSIS — C541 Malignant neoplasm of endometrium: Secondary | ICD-10-CM

## 2024-01-03 NOTE — Assessment & Plan Note (Signed)
 Alyssa Taylor is a 83 y.o. woman with Stage Ibi+ grade 1 endometrioid adenocarcinoma who presents for surveillance. MMR intact. MSS. P53 wildtype.   Patient is doing well.  She is NED on exam today.   Despite recommendation for adjuvant radiation in the setting of her high-intermediate risk factors, the patient ultimately opted not to proceed with recommended radiation.  We discussed plan for surveillance.     Per NCCN surveillance recommendations, we will have a visit with an exam every 3 months for the next 2-3 years.  We reviewed signs and symptoms that would be concerning for cancer recurrence, and I stressed the importance of calling if she develops any of the symptoms between visits. She will plan on following up with her PCP about her knee pain. Urinary incontinence is mild and manageable per pt.  Return in 6 mos

## 2024-01-03 NOTE — Patient Instructions (Signed)
 Return in 6 months

## 2024-01-03 NOTE — Progress Notes (Unsigned)
 Follow Up Note: Gyn-Onc  Alyssa Taylor 83 y.o. female  CC: Routine follow-up   HPI: The oncology history was reviewed.  Interval History: She denies any vaginal bleeding, abdominal/pelvic pain, cough, lethargy or abdominal distention. No concerns at this time.     Review of Systems  Review of Systems  Constitutional:  Negative for malaise/fatigue and weight loss.  Respiratory:  Negative for shortness of breath and wheezing.   Cardiovascular:  Negative for chest pain and leg swelling.  Gastrointestinal:  Negative for abdominal pain, blood in stool, constipation, nausea and vomiting.  Genitourinary:  Negative for dysuria, frequency, hematuria and urgency.  Musculoskeletal:  Negative for joint pain and myalgias.  Neurological:  Negative for weakness.  Psychiatric/Behavioral:  Negative for depression. The patient does not have insomnia.    Current medications, allergy, social history, past surgical history, past medical history, family history were all reviewed.    Vitals:  BP (!) 148/90 Comment: manual recehck, pt to monitor at home and follow up with PCP, MD notified  Pulse 60   Temp 98.5 F (36.9 C) (Oral)   Resp 20   Wt 204 lb 3.2 oz (92.6 kg)   LMP  (LMP Unknown)   SpO2 99%   BMI 37.35 kg/m     Physical Exam Exam conducted with a chaperone present.  Constitutional:      General: She is not in acute distress. Cardiovascular:     Rate and Rhythm: Normal rate and regular rhythm.  Pulmonary:     Effort: Pulmonary effort is normal.     Breath sounds: Normal breath sounds. No wheezing or rhonchi.  Abdominal:     Palpations: Abdomen is soft.     Tenderness: There is no abdominal tenderness. There is no right CVA tenderness or left CVA tenderness.     Hernia: No hernia is present.  Genitourinary:    General: Normal vulva.     Urethra: No urethral lesion.     Vagina: No lesions. No bleeding Musculoskeletal:     Cervical back: Neck supple.     Right lower leg: No  edema.     Left lower leg: No edema.  Lymphadenopathy:     Upper Body:     Right upper body: No supraclavicular adenopathy.     Left upper body: No supraclavicular adenopathy.     Lower Body: No right inguinal adenopathy. No left inguinal adenopathy.  Skin:    Findings: No rash.  Neurological:     Mental Status: She is oriented to person, place, and time.   Assessment/Plan:  Endometrial cancer (HCC) Alyssa Taylor is a 83 y.o. woman with Stage Ibi+ grade 1 endometrioid adenocarcinoma who presents for surveillance. MMR intact. MSS. P53 wildtype. Despite recommendation for adjuvant radiation in the setting of her high-intermediate risk factors, the patient ultimately opted not to proceed with recommended radiation. Negative symptom review, normal exam.  No evidence of recurrence     --Continue follow-up per  NCCN surveillance recommendations.  As she is approaching the 2 year mark, will transition to q 6 mth follow up - Return in 6 mos    Antionette Char, MD

## 2024-01-04 ENCOUNTER — Encounter: Payer: Self-pay | Admitting: Obstetrics & Gynecology

## 2024-04-27 ENCOUNTER — Ambulatory Visit: Admitting: Gynecologic Oncology

## 2024-05-04 ENCOUNTER — Inpatient Hospital Stay: Attending: Gynecologic Oncology | Admitting: Gynecologic Oncology

## 2024-05-04 ENCOUNTER — Encounter: Payer: Self-pay | Admitting: Gynecologic Oncology

## 2024-05-04 VITALS — BP 142/88 | HR 60 | Temp 98.4°F | Resp 20 | Wt 200.0 lb

## 2024-05-04 DIAGNOSIS — Z8542 Personal history of malignant neoplasm of other parts of uterus: Secondary | ICD-10-CM | POA: Diagnosis present

## 2024-05-04 DIAGNOSIS — C541 Malignant neoplasm of endometrium: Secondary | ICD-10-CM

## 2024-05-04 NOTE — Patient Instructions (Signed)
 It was good to see you today.  I do not see or feel any evidence of cancer recurrence on your exam.  I will see you for follow-up in 6 months.  As always, if you develop any new and concerning symptoms before your next visit, please call to see me sooner.  Would include vaginal bleeding, abdominal or pelvic pain, change to bowel or bladder function.

## 2024-05-04 NOTE — Progress Notes (Signed)
 Gynecologic Oncology Return Clinic Visit  05/04/24  Reason for Visit: follow-up in the setting of uterine cancer   Treatment History: Oncology History Overview Note  P53 wildtype MMR intact   Endometrial cancer [facility])   Initial Diagnosis   Endometrial cancer (HCC)    Initial Biopsy   EMB: CAH, at least   12/16/2021 Surgery   Hysteroscopy, D&C. Pathology revealed grade 1 endometrioid adenocarcinoma.   03/09/2022 Surgery   Robotic-assisted laparoscopic total hysterectomy with bilateral salpingo-oophorectomy, SLN biopsy bilaterally and excision of additional prominent lymph node, lysis of adhesions for 45 minutes, cystoscopy  Findings: On EUA, mobile 10 cm uterus.  On intra-abdominal entry, normal upper abdominal survey including diaphragm, liver edge, and stomach.  Normal-appearing small bowel and omentum.  Sigmoid colon with significant adhesions to the left pelvic sidewall and posterior uterus/cervix, suspected to be related to prior incidence of diverticulitis given diverticular disease noted.  Left ovary somewhat enlarged for postmenopausal status and again adherent to the colon.  Right adnexa atrophic and mildly abnormal fallopian tube.  Uterus approximately 10 cm and somewhat bulbous at the fundus.  Mapping successful to bilateral pelvic basins, right obturator sentinel lymph node and left external iliac sentinel lymph node.  Additional green lymph node noted at the bifurcation of the external and internal iliac arteries (resected as mildly prominent).  On cystoscopy, bladder dome intact, good efflux noted from bilateral ureteral orifices.    Pathology Results   Stage IB, grade 1 endometrioid 83% MI No LVSI ITCs in 1 of 3 SLNs     Interval History: Doing well.  Dealing with some areas affected by poison oak.  Denies any vaginal bleeding.  Denies any abdominal or pelvic pain.  Continues to have some urinary frequency and incontinence, unchanged.  She is not bothered by the  symptoms.  Endorses normal bowel function.  Past Medical/Surgical History: Past Medical History:  Diagnosis Date   Arthritis    right knee   Cancer (HCC)    Hernia, hiatal    Hypertension    Stroke [facility])    Vertigo     Past Surgical History:  Procedure Laterality Date   DILATATION & CURETTAGE/HYSTEROSCOPY WITH MYOSURE N/A 12/16/2021   Procedure: DILATATION & CURETTAGE OF UTERUS;  HYSTEROSCOPY;  MYOSURE;  Surgeon: Viktoria Comer SAUNDERS, MD;  Location: [facility];  Service: Gynecology;  Laterality: N/A;   DILATION AND CURETTAGE OF UTERUS N/A 1975   after a pregnancy   LYMPH NODE DISSECTION N/A 03/09/2022   Procedure: LYMPH NODE DISSECTION;  Surgeon: Viktoria Comer SAUNDERS, MD;  Location: [facility];  Service: Gynecology;  Laterality: N/A;   ROBOTIC ASSISTED TOTAL HYSTERECTOMY WITH BILATERAL SALPINGO OOPHERECTOMY Bilateral 03/09/2022   Procedure: XI ROBOTIC ASSISTED TOTAL HYSTERECTOMY WITH BILATERAL SALPINGO OOPHORECTOMY, CYSTOSCOPY;  Surgeon: Viktoria Comer SAUNDERS, MD;  Location: [facility];  Service: Gynecology;  Laterality: Bilateral;   SENTINEL NODE BIOPSY N/A 03/09/2022   Procedure: SENTINEL NODE BIOPSY;  Surgeon: Viktoria Comer SAUNDERS, MD;  Location: [facility];  Service: Gynecology;  Laterality: N/A;   TONSILLECTOMY N/A 1988   TUBAL LIGATION  1984   WISDOM TOOTH EXTRACTION      Family History  Problem Relation Age of Onset   Cancer Maternal Grandmother        colon cancer   Colon cancer Maternal Grandmother    Breast cancer Neg Hx    Ovarian cancer Neg Hx    Endometrial cancer Neg Hx    Pancreatic cancer Neg Hx    Prostate  cancer Neg Hx     Social History   Socioeconomic History   Marital status: Divorced    Spouse name: Not on file   Number of children: Not on file   Years of education: Not on file   Highest education level: Not on file  Occupational History   Not on file  Tobacco Use   Smoking status: Never   Smokeless tobacco: Never  Vaping Use   Vaping status:  Never Used  Substance and Sexual Activity   Alcohol use: Not Currently   Drug use: Never   Sexual activity: Yes  Other Topics Concern   Not on file  Social History Narrative   Not on file   Social Drivers of Health   Financial Resource Strain: Low Risk  (12/06/2020)   Received from [facility]   Overall Financial Resource Strain (CARDIA)    Difficulty of Paying Living Expenses: Not hard at all  Food Insecurity: Not on file  Transportation Needs: No Transportation Needs (04/07/2022)   PRAPARE - Administrator, Civil Service (Medical): No    Lack of Transportation (Non-Medical): No  Physical Activity: Not on file  Stress: No Stress Concern Present (12/06/2020)   Received from [facility] of Occupational Health - Occupational Stress Questionnaire    Feeling of Stress : Not at all  Social Connections: Unknown (02/09/2022)   Received from [facility]   Social Network    Social Network: Not on file    Current Medications:  Current Outpatient Medications:    aspirin EC 81 MG tablet, Take 1 tablet every day by oral route., Disp: , Rfl:    carboxymethylcellul-glycerin (REFRESH OPTIVE) 0.5-0.9 % ophthalmic solution, Place 1-2 drops into both eyes 3 (three) times daily as needed (dry/irritated eyes.)., Disp: , Rfl:    cholecalciferol (VITAMIN D) 25 MCG (1000 UNIT) tablet, Take 1,000 Units by mouth daily after lunch., Disp: , Rfl:    ketorolac  (ACULAR ) 0.5 % ophthalmic solution, 1 drop 2 (two) times daily., Disp: , Rfl:    lisinopril (ZESTRIL) 5 MG tablet, daily., Disp: , Rfl:    prednisoLONE acetate (PRED FORTE) 1 % ophthalmic suspension, 1 drop 4 (four) times daily., Disp: , Rfl:    lisinopril (ZESTRIL) 10 MG tablet, Take 10 mg by mouth every evening. (Patient not taking: Reported on 12/28/2023), Disp: , Rfl:   Review of Systems: Denies appetite changes, fevers, chills, fatigue, unexplained weight changes. Denies hearing loss, neck lumps or masses,  mouth sores, ringing in ears or voice changes. Denies cough or wheezing.  Denies shortness of breath. Denies chest pain or palpitations. Denies leg swelling. Denies abdominal distention, pain, blood in stools, constipation, diarrhea, nausea, vomiting, or early satiety. Denies pain with intercourse, dysuria, frequency, hematuria or incontinence. Denies hot flashes, pelvic pain, vaginal bleeding or vaginal discharge.   Denies joint pain, back pain or muscle pain/cramps. Denies itching, rash, or wounds. Denies dizziness, headaches, numbness or seizures. Denies swollen lymph nodes or glands, denies easy bruising or bleeding. Denies anxiety, depression, confusion, or decreased concentration.  Physical Exam: BP (!) 142/88 Comment: manual recheck, MD notified  Pulse 60   Temp 98.4 F (36.9 C) (Oral)   Resp 20   Wt 200 lb (90.7 kg)   LMP  (LMP Unknown)   SpO2 100%   BMI 36.58 kg/m  General: Alert, oriented, no acute distress. HEENT: Normocephalic, atraumatic, sclera anicteric. Chest: Clear to auscultation bilaterally. No wheezes or rhonchi. Cardiovascular: Regular rate and  rhythm, no murmurs. Abdomen: Obese, soft, nontender.  Normoactive bowel sounds.  No masses or hepatosplenomegaly appreciated.  Well-healed incisions. Extremities: Grossly normal range of motion.  Warm, well perfused.  No edema bilaterally. Skin: Erythematous area in her left upper extremity and bilateral inner thighs consistent with recent exposure to poison oak. Lymphatics: No cervical, supraclavicular, or inguinal adenopathy. GU: Normal appearing external genitalia without erythema, excoriation, or lesions.  Speculum exam reveals cuff intact, some physiologic discharge.  No bleeding, no lesions noted at the vaginal cuff.  Bimanual exam reveals intact without nodularity or masses.  Rectovaginal exam confirms findings.  Laboratory & Radiologic Studies: None new  Assessment & Plan: Alyssa Taylor is a 83 y.o. woman with  Stage Ibi+ grade 1 endometrioid adenocarcinoma who presents for follow-up. Surgery 02/2022. MMR intact. MSS.   Patient is doing well.  She is NED on exam today.   Despite recommendation for adjuvant radiation in the setting of her high-intermediate risk factors, the patient ultimately opted not to proceed with recommended radiation.  We discussed plan for surveillance.  Per NCCN surveillance recommendations, we will have a visit with an exam every 3 months for the next 2-3 years.  We reviewed signs and symptoms that would be concerning for cancer recurrence, and I stressed the importance of calling if she develops any of the symptoms between visits.  20 minutes of total time was spent for this patient encounter, including preparation, face-to-face counseling with the patient and coordination of care, and documentation of the encounter.  Comer Dollar, MD  Division of Gynecologic Oncology  Department of Obstetrics and Gynecology  [facility]

## 2024-08-22 ENCOUNTER — Telehealth: Payer: Self-pay | Admitting: *Deleted

## 2024-08-22 NOTE — Telephone Encounter (Signed)
 Per provider moved appt from 1/29 to 2/12, patient aware

## 2024-10-25 ENCOUNTER — Ambulatory Visit: Admitting: Gynecologic Oncology

## 2024-11-08 ENCOUNTER — Inpatient Hospital Stay: Payer: Self-pay | Admitting: Gynecologic Oncology
